# Patient Record
Sex: Male | Born: 2000 | Race: Black or African American | Hispanic: No | Marital: Single | Smoking: Never smoker
Health system: Southern US, Community
[De-identification: ages and names within clinical notes are randomized; demographics above are authoritative.]

## PROBLEM LIST (undated history)

## (undated) DIAGNOSIS — F419 Anxiety disorder, unspecified: Secondary | ICD-10-CM

## (undated) DIAGNOSIS — K219 Gastro-esophageal reflux disease without esophagitis: Secondary | ICD-10-CM

## (undated) HISTORY — DX: Anxiety disorder, unspecified: F41.9

## (undated) HISTORY — DX: Gastro-esophageal reflux disease without esophagitis: K21.9

---
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
## 2021-01-01 ENCOUNTER — Encounter: Payer: Self-pay | Admitting: Gastroenterology

## 2021-01-09 DIAGNOSIS — Z713 Dietary counseling and surveillance: Secondary | ICD-10-CM | POA: Diagnosis not present

## 2021-01-09 DIAGNOSIS — Z7182 Exercise counseling: Secondary | ICD-10-CM | POA: Diagnosis not present

## 2021-01-09 DIAGNOSIS — Z00129 Encounter for routine child health examination without abnormal findings: Secondary | ICD-10-CM | POA: Diagnosis not present

## 2021-01-09 DIAGNOSIS — Z68.41 Body mass index (BMI) pediatric, 5th percentile to less than 85th percentile for age: Secondary | ICD-10-CM | POA: Diagnosis not present

## 2021-01-10 DIAGNOSIS — Z130 Encounter for screening for diseases of the blood and blood-forming organs and certain disorders involving the immune mechanism: Secondary | ICD-10-CM | POA: Diagnosis not present

## 2021-01-13 DIAGNOSIS — Z419 Encounter for procedure for purposes other than remedying health state, unspecified: Secondary | ICD-10-CM | POA: Diagnosis not present

## 2021-02-13 DIAGNOSIS — Z419 Encounter for procedure for purposes other than remedying health state, unspecified: Secondary | ICD-10-CM | POA: Diagnosis not present

## 2021-03-15 DIAGNOSIS — Z419 Encounter for procedure for purposes other than remedying health state, unspecified: Secondary | ICD-10-CM | POA: Diagnosis not present

## 2021-04-15 DIAGNOSIS — Z419 Encounter for procedure for purposes other than remedying health state, unspecified: Secondary | ICD-10-CM | POA: Diagnosis not present

## 2021-05-01 ENCOUNTER — Encounter: Payer: Self-pay | Admitting: Physician Assistant

## 2021-05-01 ENCOUNTER — Other Ambulatory Visit (INDEPENDENT_AMBULATORY_CARE_PROVIDER_SITE_OTHER): Payer: Medicaid Other

## 2021-05-01 ENCOUNTER — Ambulatory Visit (INDEPENDENT_AMBULATORY_CARE_PROVIDER_SITE_OTHER): Payer: Medicaid Other | Admitting: Physician Assistant

## 2021-05-01 VITALS — BP 110/60 | HR 98 | Ht 70.0 in | Wt 165.0 lb

## 2021-05-01 DIAGNOSIS — R1010 Upper abdominal pain, unspecified: Secondary | ICD-10-CM

## 2021-05-01 DIAGNOSIS — R112 Nausea with vomiting, unspecified: Secondary | ICD-10-CM

## 2021-05-01 DIAGNOSIS — K2970 Gastritis, unspecified, without bleeding: Secondary | ICD-10-CM | POA: Diagnosis not present

## 2021-05-01 DIAGNOSIS — R634 Abnormal weight loss: Secondary | ICD-10-CM

## 2021-05-01 LAB — CBC WITH DIFFERENTIAL/PLATELET
Basophils Absolute: 0 10*3/uL (ref 0.0–0.1)
Basophils Relative: 0.6 % (ref 0.0–3.0)
Eosinophils Absolute: 0 10*3/uL (ref 0.0–0.7)
Eosinophils Relative: 0.5 % (ref 0.0–5.0)
HCT: 45.6 % (ref 36.0–49.0)
Hemoglobin: 15.2 g/dL (ref 12.0–16.0)
Lymphocytes Relative: 14 % — ABNORMAL LOW (ref 24.0–48.0)
Lymphs Abs: 0.9 10*3/uL (ref 0.7–4.0)
MCHC: 33.4 g/dL (ref 31.0–37.0)
MCV: 85.4 fl (ref 78.0–98.0)
Monocytes Absolute: 1 10*3/uL (ref 0.1–1.0)
Monocytes Relative: 16.5 % — ABNORMAL HIGH (ref 3.0–12.0)
Neutro Abs: 4.3 10*3/uL (ref 1.4–7.7)
Neutrophils Relative %: 68.4 % (ref 43.0–71.0)
Platelets: 251 10*3/uL (ref 150.0–575.0)
RBC: 5.35 Mil/uL (ref 3.80–5.70)
RDW: 12.9 % (ref 11.4–15.5)
WBC: 6.3 10*3/uL (ref 4.5–13.5)

## 2021-05-01 LAB — COMPREHENSIVE METABOLIC PANEL
ALT: 12 U/L (ref 0–53)
AST: 14 U/L (ref 0–37)
Albumin: 4.5 g/dL (ref 3.5–5.2)
Alkaline Phosphatase: 70 U/L (ref 52–171)
BUN: 8 mg/dL (ref 6–23)
CO2: 30 mEq/L (ref 19–32)
Calcium: 9.6 mg/dL (ref 8.4–10.5)
Chloride: 104 mEq/L (ref 96–112)
Creatinine, Ser: 1.33 mg/dL (ref 0.40–1.50)
GFR: 77.49 mL/min (ref >60.00)
Glucose, Bld: 109 mg/dL — ABNORMAL HIGH (ref 70–99)
Potassium: 3.8 mEq/L (ref 3.5–5.1)
Sodium: 139 mEq/L (ref 135–145)
Total Bilirubin: 0.6 mg/dL (ref 0.2–1.2)
Total Protein: 7.5 g/dL (ref 6.0–8.3)

## 2021-05-01 LAB — SEDIMENTATION RATE: Sed Rate: 4 mm/hr (ref 0–15)

## 2021-05-01 LAB — LIPASE: Lipase: 23 U/L (ref 11.0–59.0)

## 2021-05-01 LAB — C-REACTIVE PROTEIN: CRP: 1.7 mg/dL (ref 0.5–20.0)

## 2021-05-01 MED ORDER — FAMOTIDINE 40 MG PO TABS: 40.0000 mg | ORAL_TABLET | Freq: Every day | ORAL | 3 refills | Status: DC

## 2021-05-01 MED ORDER — OMEPRAZOLE 40 MG PO CPDR: 40.0000 mg | DELAYED_RELEASE_CAPSULE | Freq: Every day | ORAL | 4 refills | Status: DC

## 2021-05-01 MED ORDER — ONDANSETRON HCL 4 MG PO TABS: 4.0000 mg | ORAL_TABLET | Freq: Four times a day (QID) | ORAL | 1 refills | Status: DC | PRN

## 2021-05-01 NOTE — Patient Instructions (Signed)
If you are age 19 or younger, your body mass index should be between 19-25. Your Body mass index is 23.68 kg/m. If this is out of the aformentioned range listed, please consider follow up with your Primary Care Provider.  ________________________________________________________  The [facility] providers would like to encourage you to use [facility] to communicate with providers for non-urgent requests or questions.  Due to long hold times on the telephone, sending your provider a message by [facility] may be a faster and more efficient way to get a response.  Please allow 48 business hours for a response.  Please remember that this is for non-urgent requests.  _______________________________________________________  Dennis Bast have been scheduled for a CT scan of the abdomen and pelvis at [facility], 1st floor Radiology. You are scheduled on 05/09/2021  at 1:00 pm. You should arrive 15 minutes prior to your appointment time for registration.  Please pick up 2 bottles of contrast from [facility] at least 3 days prior to your scan. The solution may taste better if refrigerated, but do NOT add ice or any other liquid to this solution. Shake well before drinking.   Please follow the written instructions below on the day of your exam:   1) Do not eat anything after 9:00 am (4 hours prior to your test)   2) Drink 1 bottle of contrast @ 11;00 am (2 hours prior to your exam)  Remember to shake well before drinking and do NOT pour over ice.     Drink 1 bottle of contrast @ 12:00 pm (1 hour prior to your exam)   You may take any medications as prescribed with a small amount of water, if necessary. If you take any of the following medications: METFORMIN, GLUCOPHAGE, GLUCOVANCE, AVANDAMET, RIOMET, FORTAMET, Brice MET, JANUMET, GLUMETZA or METAGLIP, you MAY be asked to HOLD this medication 48 hours AFTER the exam.   The purpose of you drinking the oral contrast is to aid in the visualization of your intestinal  tract. The contrast solution may cause some diarrhea. Depending on your individual set of symptoms, you may also receive an intravenous injection of x-ray contrast/dye. Plan on being at [facility] for 45 minutes or longer, depending on the type of exam you are having performed.   If you have any questions regarding your exam or if you need to reschedule, you may call [facility] at [phone] between the hours of 8:00 am and 5:00 pm, Monday-Friday.   Your provider has requested that you go to the basement level for lab work before leaving today. Press "B" on the elevator. The lab is located at the first door on the left as you exit the elevator.  Continue Omeprazole 40 mg 1 capsule in the morning.  START Famotidine 40 mg 1 tablet before bed.  START Ondansetron 4 mg 1 tablet every 6 hours as needed.  Eat small snacks  between meals. Follow a bland diet.

## 2021-05-01 NOTE — Progress Notes (Signed)
Subjective:    Patient ID: Steven Cross, male    DOB: Nov 11, 2000, 20 y.o.   MRN: [id number]  HPI Steven Cross is a 20 year old African-American male, established with Dr. Ardis Hughs.  He had undergone work-up in July 2022 for complaints of GERD symptoms with burning in the upper abdomen and chest.  He had also had some intermittent rectal pain and blood in his stool. He underwent EGD which showed mild gastritis, biopsies were negative for H. pylori, no intestinal metaplasia.  Colonoscopy done at that same time pertinent for small internal hemorrhoids and otherwise unremarkable. He has been placed on omeprazole 40 mg p.o. every morning and has been taking a fiber supplement for the hemorrhoids. 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 He says he is still having abdominal pain with eating very frequently , and that the pain is generally exacerbated fairly immediately after he starts eating.  If he tries to continue to eat and push through this he will wind up vomiting.  He usually stops eating and then may not eat for many hours thereafter.  His weight is down about 10 pounds over the past 3-1/2 months.  He relates intermittent abdominal cramping, no changes with his bowel movements, no melena or hematochezia He is not taking any regular aspirin or NSAIDs, denies any regular EtOH use. He has been taking an over-the-counter supplement for nausea which she showed me and this primarily contains ginger. He has been taking the omeprazole regularly and says that it does help with heartburn symptoms but has not really helped with the pain. He is frustrated by his inability to eat, weight loss and persistent symptoms.  Review of Systems. Pertinent positive and negative review of systems were noted in the above HPI section.  All other review of systems was otherwise negative.   Outpatient Encounter Medications as of 05/01/2021  Medication Sig   calcium carbonate (TUMS  - DOSED IN MG ELEMENTAL CALCIUM) 500 MG chewable tablet Chew 1 tablet by mouth as needed for indigestion or heartburn.   famotidine (PEPCID) 40 MG tablet Take 1 tablet (40 mg total) by mouth daily.   ondansetron (ZOFRAN) 4 MG tablet Take 1 tablet (4 mg total) by mouth every 6 (six) hours as needed for nausea or vomiting.   [DISCONTINUED] omeprazole (PRILOSEC) 40 MG capsule Take 40 mg by mouth daily.   omeprazole (PRILOSEC) 40 MG capsule Take 1 capsule (40 mg total) by mouth daily.   No facility-administered encounter medications on file as of 05/01/2021.   No Known Allergies Patient Active Problem List   Diagnosis Date Noted   GERD without esophagitis 12/13/2020   Social History   Socioeconomic History   Marital status: Single    Spouse name: Not on file   Number of children: Not on file   Years of education: Not on file   Highest education level: Not on file  Occupational History   Not on file  Tobacco Use   Smoking status: Never   Smokeless tobacco: Never  Vaping Use   Vaping Use: Never used  Substance and Sexual Activity   Alcohol use: No   Drug use: No   Sexual activity: Not on file  Other Topics Concern   Not on file  Social History Narrative   Not on file   Social Determinants of Health   Financial Resource Strain: Not on file  Food Insecurity: Not on file  Transportation Needs: Not on file  Physical  Activity: Not on file  Stress: Not on file  Social Connections: Not on file  Intimate Partner Violence: Not on file    [name] family history includes Asthma in his mother.      Objective:    Vitals:   05/01/21 1417  BP: 110/60  Pulse: 98    Physical Exam Well-developed well-nourished young African-American male in no acute distress.  Accompanied by his mother height, Weight, 165 BMI 23.6  HEENT; nontraumatic normocephalic, EOMI, PE R LA, sclera anicteric. 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  He has had a 10 pound weight loss over the past 3 months.  EGD July 2022 pertinent for mild gastritis/no H. pylori and colonoscopy July 2022 negative except internal hemorrhoids  Etiology of his symptoms is not clear, certainly out of proportion to what is normally seen with gastritis.  Rule out other intra-abdominal inflammatory process/IBD, rule out possible gallbladder disease, doubt IBS/functional dyspepsia with weight loss  Plan; CBC with differential, c-Met, sed rate, TSH Schedule for CT of the abdomen pelvis with contrast. Continue omeprazole 40 mg p.o. every morning AC breakfast Add famotidine/Pepcid 40 mg every afternoon to be taken 1 to 2 hours before bedtime Zofran 4 mg every 6 hours as needed for nausea Patient encouraged to eat a bland diet, and add in between meal high-protein/bland snacks to avoid further weight loss. Further recommendations pending results of above.  Harvy Riera S Esther Bradstreet PA-C 05/01/2021   Cc: [place], [place]

## 2021-05-02 NOTE — Progress Notes (Signed)
I agree with the above note, plan 

## 2021-05-09 ENCOUNTER — Ambulatory Visit (HOSPITAL_COMMUNITY)
Admission: RE | Admit: 2021-05-09 | Discharge: 2021-05-09 | Disposition: A | Discharge status: Home / Self Care | Payer: Medicaid Other | Source: Ambulatory Visit | Attending: Physician Assistant | Admitting: Physician Assistant

## 2021-05-09 ENCOUNTER — Other Ambulatory Visit: Payer: Self-pay

## 2021-05-09 ENCOUNTER — Ambulatory Visit (HOSPITAL_COMMUNITY): Payer: Medicaid Other

## 2021-05-09 ENCOUNTER — Encounter (HOSPITAL_COMMUNITY): Payer: Self-pay

## 2021-05-09 DIAGNOSIS — R634 Abnormal weight loss: Secondary | ICD-10-CM | POA: Diagnosis not present

## 2021-05-09 DIAGNOSIS — R1110 Vomiting, unspecified: Secondary | ICD-10-CM | POA: Diagnosis not present

## 2021-05-09 DIAGNOSIS — K2970 Gastritis, unspecified, without bleeding: Secondary | ICD-10-CM | POA: Diagnosis not present

## 2021-05-09 DIAGNOSIS — R1010 Upper abdominal pain, unspecified: Secondary | ICD-10-CM | POA: Insufficient documentation

## 2021-05-09 DIAGNOSIS — R112 Nausea with vomiting, unspecified: Secondary | ICD-10-CM | POA: Insufficient documentation

## 2021-05-09 DIAGNOSIS — R109 Unspecified abdominal pain: Secondary | ICD-10-CM | POA: Diagnosis not present

## 2021-05-09 MED ORDER — IOHEXOL 350 MG/ML SOLN
75.0000 mL | Freq: Once | INTRAVENOUS | Status: AC | PRN
  Administered 2021-05-09: 75 mL via INTRAVENOUS

## 2021-05-09 MED ORDER — SODIUM CHLORIDE (PF) 0.9 % IJ SOLN
INTRAMUSCULAR | Status: AC
  Filled 2021-05-09: qty 50

## 2021-05-14 ENCOUNTER — Telehealth: Payer: Self-pay | Admitting: Physician Assistant

## 2021-05-14 NOTE — Telephone Encounter (Signed)
Patient's mother returned your call regarding CT results.  Please call her at [phone].  Thank you.

## 2021-05-14 NOTE — Telephone Encounter (Signed)
See CT result note

## 2021-05-15 DIAGNOSIS — Z419 Encounter for procedure for purposes other than remedying health state, unspecified: Secondary | ICD-10-CM | POA: Diagnosis not present

## 2021-05-28 ENCOUNTER — Ambulatory Visit (INDEPENDENT_AMBULATORY_CARE_PROVIDER_SITE_OTHER): Payer: Medicaid Other | Admitting: Gastroenterology

## 2021-05-28 ENCOUNTER — Encounter: Payer: Self-pay | Admitting: Gastroenterology

## 2021-05-28 VITALS — BP 84/48 | HR 64 | Ht 69.0 in | Wt 164.2 lb

## 2021-05-28 DIAGNOSIS — K219 Gastro-esophageal reflux disease without esophagitis: Secondary | ICD-10-CM

## 2021-05-28 MED ORDER — OMEPRAZOLE 40 MG PO CPDR: DELAYED_RELEASE_CAPSULE | ORAL | 4 refills | Status: DC

## 2021-05-28 NOTE — Progress Notes (Signed)
Review of pertinent gastrointestinal problems: 1.  Minor rectal bleeding led to colonoscopy July 2022.  Internal hemorrhoids were found.  The examination was otherwise normal.  I recommended that he try fiber supplements on a daily basis. 2.  Epigastric abdominal pain led to upper endoscopy July 2022.  This showed minimal nonspecific distal gastritis.  Biopsies were negative for H. pylori.  HPI: This is a very pleasant 20 year old man who is here with his mother today.   He was last here in our office and he saw Amy about 1 month ago.  He was having abdominal pain, usually postprandial, intermittent nausea and vomiting.  Blood work and imaging were done.  He was recommended to take omeprazole once daily and Pepcid at bedtime.  Blood work November 2022 showed normal CBC, normal CRP, normal sed rate, normal lipase, normal complete metabolic profile. CT scan abdomen pelvis with IV and oral contrast was essentially normal except for large stool burden.   His weight is 1 pound lower than it was 1 month ago.  Same scale.  He is doing much much better on omeprazole 40 mg once every morning and Pepcid 40 mg at bedtime every night.  On this regimen he has no GI symptoms at all.  His mother is asking for a school note because of some absences that he suffered while undergoing all of this testing.   ROS: complete GI ROS as described in HPI, all other review negative.  Constitutional:  No unintentional weight loss   Past Medical History:  Diagnosis Date   Anxiety    GERD (gastroesophageal reflux disease)     History reviewed. No pertinent surgical history.  Current Outpatient Medications  Medication Sig Dispense Refill   calcium carbonate (TUMS - DOSED IN MG ELEMENTAL CALCIUM) 500 MG chewable tablet Chew 1 tablet by mouth as needed for indigestion or heartburn.     famotidine (PEPCID) 40 MG tablet Take 1 tablet (40 mg total) by mouth daily. 30 tablet 3   omeprazole (PRILOSEC) 40 MG capsule  Take 1 capsule (40 mg total) by mouth daily. 30 capsule 4   ondansetron (ZOFRAN) 4 MG tablet Take 1 tablet (4 mg total) by mouth every 6 (six) hours as needed for nausea or vomiting. 40 tablet 1   No current facility-administered medications for this visit.    Allergies as of 05/28/2021   (No Known Allergies)    Family History  Problem Relation Age of Onset   Asthma Mother    Colon cancer Neg Hx    Esophageal cancer Neg Hx    Stomach cancer Neg Hx     Social History   Socioeconomic History   Marital status: Single    Spouse name: Not on file   Number of children: Not on file   Years of education: Not on file   Highest education level: Not on file  Occupational History   Not on file  Tobacco Use   Smoking status: Never   Smokeless tobacco: Never  Vaping Use   Vaping Use: Never used  Substance and Sexual Activity   Alcohol use: No   Drug use: No   Sexual activity: Not on file  Other Topics Concern   Not on file  Social History Narrative   Not on file   Social Determinants of Health   Financial Resource Strain: Not on file  Food Insecurity: Not on file  Transportation Needs: Not on file  Physical Activity: Not on file  Stress: Not on file  Social Connections: Not on file  Intimate Partner Violence: Not on file     Physical Exam: BP (!) 84/48 (BP Location: Left Arm, Patient Position: Sitting, Cuff Size: Normal)    Pulse 64    Ht 5\' 9"  (1.753 m) Comment: height measured without shoes   Wt 164 lb 4 oz (74.5 kg)    BMI 24.26 kg/m  Constitutional: generally well-appearing Psychiatric: alert and oriented x3 Abdomen: soft, nontender, nondistended, no obvious ascites, no peritoneal signs, normal bowel sounds No peripheral edema noted in lower extremities  Assessment and plan: 20 y.o. male with GERD related symptoms  Colonoscopy, EGD, CT scan and blood work were all essentially unrevealing.  He is much improved however on proton pump inhibitor every morning and  Pepcid H2 blocker at bedtime every night.  He is quite young.  I asked that he try to taper off the proton pump inhibitor and take only the H2 blocker at bedtime every night.  He will follow-up on an as-needed basis.  I am happy to help with a school note for absences that he suffered when undergoing TESTING.  Please see the "Patient Instructions" section for addition details about the plan.  12, MD [facility] 05/28/2021, 10:52 AM   Total time on date of encounter was 25 minutes (this included time spent preparing to see the patient reviewing records; obtaining and/or reviewing separately obtained history; performing a medically appropriate exam and/or evaluation; counseling and educating the patient and family if present; ordering medications, tests or procedures if applicable; and documenting clinical information in the health record).

## 2021-05-28 NOTE — Patient Instructions (Signed)
If you are age 19 or younger, your body mass index should be between 19-25. Your Body mass index is 24.26 kg/m. If this is out of the aformentioned range listed, please consider follow up with your Primary Care Provider.  _______________________________________________________  The [facility] providers would like to encourage you to use [facility] to communicate with providers for non-urgent requests or questions.  Due to long hold times on the telephone, sending your provider a message by [facility] may be a faster and more efficient way to get a response.  Please allow 48 business hours for a response.  Please remember that this is for non-urgent requests.  _______________________________________________________  DECREASE: omeprazole to one capsule every other day for 1 week, then one capsule every third day for one week, then discontinue.  CONTINUE: Pepcid at bedtime.  You will follow up in our office on an as needed basis.  Thank you for entrusting me with your care and choosing [facility].  Dr Christella Hartigan

## 2021-06-15 DIAGNOSIS — Z419 Encounter for procedure for purposes other than remedying health state, unspecified: Secondary | ICD-10-CM | POA: Diagnosis not present

## 2021-07-16 DIAGNOSIS — Z419 Encounter for procedure for purposes other than remedying health state, unspecified: Secondary | ICD-10-CM | POA: Diagnosis not present

## 2021-08-13 DIAGNOSIS — Z419 Encounter for procedure for purposes other than remedying health state, unspecified: Secondary | ICD-10-CM | POA: Diagnosis not present

## 2021-09-13 DIAGNOSIS — Z419 Encounter for procedure for purposes other than remedying health state, unspecified: Secondary | ICD-10-CM | POA: Diagnosis not present

## 2021-10-13 DIAGNOSIS — Z419 Encounter for procedure for purposes other than remedying health state, unspecified: Secondary | ICD-10-CM | POA: Diagnosis not present

## 2021-11-13 DIAGNOSIS — Z419 Encounter for procedure for purposes other than remedying health state, unspecified: Secondary | ICD-10-CM | POA: Diagnosis not present

## 2021-12-13 DIAGNOSIS — Z419 Encounter for procedure for purposes other than remedying health state, unspecified: Secondary | ICD-10-CM | POA: Diagnosis not present

## 2022-01-07 DIAGNOSIS — M25512 Pain in left shoulder: Secondary | ICD-10-CM | POA: Diagnosis not present

## 2022-01-13 DIAGNOSIS — Z419 Encounter for procedure for purposes other than remedying health state, unspecified: Secondary | ICD-10-CM | POA: Diagnosis not present

## 2022-01-20 ENCOUNTER — Other Ambulatory Visit (HOSPITAL_COMMUNITY): Payer: Self-pay | Admitting: Orthopaedic Surgery

## 2022-01-20 ENCOUNTER — Other Ambulatory Visit: Payer: Self-pay | Admitting: Orthopaedic Surgery

## 2022-01-20 DIAGNOSIS — S43432A Superior glenoid labrum lesion of left shoulder, initial encounter: Secondary | ICD-10-CM

## 2022-01-20 DIAGNOSIS — M25512 Pain in left shoulder: Secondary | ICD-10-CM | POA: Diagnosis not present

## 2022-01-28 ENCOUNTER — Ambulatory Visit (HOSPITAL_COMMUNITY)
Admission: RE | Admit: 2022-01-28 | Discharge: 2022-01-28 | Disposition: A | Discharge status: Home / Self Care | Payer: Medicaid Other | Source: Ambulatory Visit | Attending: Orthopaedic Surgery | Admitting: Orthopaedic Surgery

## 2022-01-28 DIAGNOSIS — S43432A Superior glenoid labrum lesion of left shoulder, initial encounter: Secondary | ICD-10-CM | POA: Insufficient documentation

## 2022-01-28 MED ORDER — GADOBUTROL 1 MMOL/ML IV SOLN
0.0500 mL | Freq: Once | INTRAVENOUS | Status: AC | PRN
  Administered 2022-01-28: 0.05 mL

## 2022-01-28 MED ORDER — IOHEXOL 300 MG/ML  SOLN
50.0000 mL | Freq: Once | INTRAMUSCULAR | Status: AC | PRN
  Administered 2022-01-28: 50 mL via INTRA_ARTICULAR

## 2022-01-28 MED ORDER — LIDOCAINE HCL (PF) 1 % IJ SOLN
INTRAMUSCULAR | Status: AC
  Filled 2022-01-28: qty 5

## 2022-01-30 DIAGNOSIS — M25512 Pain in left shoulder: Secondary | ICD-10-CM | POA: Diagnosis not present

## 2022-02-13 DIAGNOSIS — Z419 Encounter for procedure for purposes other than remedying health state, unspecified: Secondary | ICD-10-CM | POA: Diagnosis not present

## 2022-02-24 ENCOUNTER — Encounter (HOSPITAL_BASED_OUTPATIENT_CLINIC_OR_DEPARTMENT_OTHER): Payer: Self-pay | Admitting: Orthopaedic Surgery

## 2022-02-24 ENCOUNTER — Other Ambulatory Visit: Payer: Self-pay

## 2022-03-04 NOTE — Discharge Instructions (Signed)
Ophelia Charter MD, MPH Noemi Chapel, PA-C [place] [address] [phone] (tel)   [phone] (fax)   POST-OPERATIVE INSTRUCTIONS - SHOULDER ARTHROSCOPY  WOUND CARE You may remove the Operative Dressing on Post-Op Day #3 (72hrs after surgery).   Alternatively if you would like you can leave dressing on until follow-up if within 7-8 days but keep it dry. Leave steri-strips in place until they fall off on their own, usually 2 weeks postop. There may be a small amount of fluid/bleeding leaking at the surgical site.  This is normal; the shoulder is filled with fluid during the procedure and can leak for 24-48hrs after surgery.  You may change/reinforce the bandage as needed.  Use the Cryocuff or Ice as often as possible for the first 7 days, then as needed for pain relief. Always keep a towel, ACE wrap or other barrier between the cooling unit and your skin.  You may shower on Post-Op Day #3. Gently pat the area dry.  Do not soak the shoulder in water or submerge it.  Keep incisions as dry as possible. Do not go swimming in the pool or ocean until 4 weeks after surgery or when otherwise instructed.    EXERCISES Wear the sling at all times  You may remove the sling for showering, but keep the arm across the chest or in a secondary sling.     It is normal for your fingers/hand to become more swollen after surgery and discolored from bruising.   This will resolve over the first few weeks usually after surgery. Please continue to ambulate and do not stay sitting or lying for too long.  Perform foot and wrist pumps to assist in circulation.  PHYSICAL THERAPY - You will begin physical therapy soon after surgery (unless otherwise specified) - Please call to set up an appointment, if you do not already have one  - Let our office if there are any issues with scheduling your therapy  - Our office will call you to schedule post-op physical  therapy  REGIONAL ANESTHESIA (NERVE BLOCKS) The anesthesia team may have performed a nerve block for you this is a great tool used to minimize pain.   The block may start wearing off overnight (between 8-24 hours postop) When the block wears off, your pain may go from nearly zero to the pain you would have had postop without the block. This is an abrupt transition but nothing dangerous is happening.   This can be a challenging period but utilize your as needed pain medications to try and manage this period. We suggest you use the pain medication the first night prior to going to bed, to ease this transition.  You may take an extra dose of narcotic when this happens if needed  POST-OP MEDICATIONS- Multimodal approach to pain control In general your pain will be controlled with a combination of substances.  Prescriptions unless otherwise discussed are electronically sent to your pharmacy.  This is a carefully made plan we use to minimize narcotic use.     Meloxicam - Anti-inflammatory medication taken on a scheduled basis Acetaminophen - Non-narcotic pain medicine taken on a scheduled basis. Next tylenol dose after 3pm. Oxycodone - This is a strong narcotic, to be used only on an "as needed" basis for SEVERE pain. Zofran - take as needed for nausea   FOLLOW-UP If you develop a Fever (?101.5), Redness or Drainage from the surgical incision site, please call our office to arrange for  an evaluation. Please call the office to schedule a follow-up appointment for your first post-operative appointment, 7-10 days post-operatively.    HELPFUL INFORMATION   You may be more comfortable sleeping in a semi-seated position the first few nights following surgery.  Keep a pillow propped under the elbow and forearm for comfort.  If you have a recliner type of chair it might be beneficial.  If not that is fine too, but it would be helpful to sleep propped up with pillows behind your operated shoulder as  well under your elbow and forearm.  This will reduce pulling on the suture lines.  When dressing, put your operative arm in the sleeve first.  When getting undressed, take your operative arm out last.  Loose fitting, button-down shirts are recommended.  Often in the first days after surgery you may be more comfortable keeping your operative arm under your shirt and not through the sleeve.  You may return to work/school in the next couple of days when you feel up to it.  Desk work and typing in the sling is fine.  We suggest you use the pain medication the first night prior to going to bed, in order to ease any pain when the anesthesia wears off. You should avoid taking pain medications on an empty stomach as it will make you nauseous.  You should wean off your narcotic medicines as soon as you are able.  Most patients will be off or using minimal narcotics before their first postop appointment.   Do not drink alcoholic beverages or take illicit drugs when taking pain medications.  It is against the law to drive while taking narcotics.  In some states it is against the law to drive while your arm is in a sling.   Pain medication may make you constipated.  Below are a few solutions to try in this order: Decrease the amount of pain medication if you aren't having pain. Drink lots of decaffeinated fluids. Drink prune juice and/or eat dried prunes  If the first 3 don't work start with additional solutions Take Colace - an over-the-counter stool softener Take Senokot - an over-the-counter laxative Take Miralax - a stronger over-the-counter laxative  For more information including helpful videos and documents visit our website:   [email or web address]    Post Anesthesia Home Care Instructions  Activity: Get plenty of rest for the remainder of the day. A responsible individual must stay with you for 24 hours following the procedure.  For the next 24 hours, DO  NOT: -Drive a car -Advertising copywriter -Drink alcoholic beverages -Take any medication unless instructed by your physician -Make any legal decisions or sign important papers.  Meals: Start with liquid foods such as gelatin or soup. Progress to regular foods as tolerated. Avoid greasy, spicy, heavy foods. If nausea and/or vomiting occur, drink only clear liquids until the nausea and/or vomiting subsides. Call your physician if vomiting continues.  Special Instructions/Symptoms: Your throat may feel dry or sore from the anesthesia or the breathing tube placed in your throat during surgery. If this causes discomfort, gargle with warm salt water. The discomfort should disappear within 24 hours.  If you had a scopolamine patch placed behind your ear for the management of post- operative nausea and/or vomiting:  1. The medication in the patch is effective for 72 hours, after which it should be removed.  Wrap patch in a tissue and discard in the trash. Wash hands thoroughly with soap and water. 2. You may remove the  patch earlier than 72 hours if you experience unpleasant side effects which may include dry mouth, dizziness or visual disturbances. 3. Avoid touching the patch. Wash your hands with soap and water after contact with the patch.    Information for Discharge Teaching: EXPAREL (bupivacaine liposome injectable suspension)   Your surgeon or anesthesiologist gave you EXPAREL(bupivacaine) to help control your pain after surgery.  EXPAREL is a local anesthetic that provides pain relief by numbing the tissue around the surgical site. EXPAREL is designed to release pain medication over time and can control pain for up to 72 hours. Depending on how you respond to EXPAREL, you may require less pain medication during your recovery.  Possible side effects: Temporary loss of sensation or ability to move in the area where bupivacaine was injected. Nausea, vomiting, constipation Rarely, numbness and  tingling in your mouth or lips, lightheadedness, or anxiety may occur. Call your doctor right away if you think you may be experiencing any of these sensations, or if you have other questions regarding possible side effects.  Follow all other discharge instructions given to you by your surgeon or nurse. Eat a healthy diet and drink plenty of water or other fluids.  If you return to the hospital for any reason within 96 hours following the administration of EXPAREL, it is important for health care providers to know that you have received this anesthetic. A teal colored band has been placed on your arm with the date, time and amount of EXPAREL you have received in order to alert and inform your health care providers. Please leave this armband in place for the full 96 hours following administration, and then you may remove the band.

## 2022-03-04 NOTE — H&P (Signed)
PREOPERATIVE H&P  Chief Complaint: LEFT SHOULDER CARTILAGE DISORDER, INSTABILITY  HPI: Steven Cross is a 21 y.o. male who is scheduled for, Procedure(s): ARTHROSCOPY SHOULDER ARTHROSCOPIC BANKART REPAIR.   Patient has a past medical history significant for GERD.   The patient went to urgent care and had an injection for shoulder pain.  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  He did therapy afterwards and did okay.  He continues to have a sense of instability.  He had a subluxation event recently and was seen and managed with a physician based exercise plan and therapy and has continued to have a sense of instability.  He continues to feel that the shoulder is unstable.    Symptoms are rated as moderate to severe, and have been worsening.  This is significantly impairing activities of daily living.    Please see clinic note for further details on this patient's care.    He has elected for surgical management.   Past Medical History:  Diagnosis Date   Anxiety    GERD (gastroesophageal reflux disease)    History reviewed. No pertinent surgical history. Social History   Socioeconomic History   Marital status: Single    Spouse name: Not on file   Number of children: Not on file   Years of education: Not on file   Highest education level: Not on file  Occupational History   Not on file  Tobacco Use   Smoking status: Never   Smokeless tobacco: Never  Vaping Use   Vaping Use: Never used  Substance and Sexual Activity   Alcohol use: No   Drug use: No   Sexual activity: Not on file  Other Topics Concern   Not on file  Social History Narrative   Not on file   Social Determinants of Health   Financial Resource Strain: Not on file  Food Insecurity: Not on file  Transportation Needs: Not on file  Physical Activity: Not on file  Stress:  Not on file  Social Connections: Not on file   Family History  Problem Relation Age of Onset   Asthma Mother    Colon cancer Neg Hx    Esophageal cancer Neg Hx    Stomach cancer Neg Hx    No Known Allergies Prior to Admission medications   Medication Sig Start Date End Date Taking? Authorizing Provider  calcium carbonate (TUMS - DOSED IN MG ELEMENTAL CALCIUM) 500 MG chewable tablet Chew 1 tablet by mouth as needed for indigestion or heartburn.    [provider]  famotidine (PEPCID) 40 MG tablet Take 1 tablet (40 mg total) by mouth daily. Patient taking differently: Take 40 mg by mouth daily as needed for indigestion. 05/01/21   Esterwood, Amy S, PA-C  omeprazole (PRILOSEC) 40 MG capsule Take 1 capsule every other day for 1 week, then 1 capsule every third day for 1 week, then discontinue. 05/28/21   Rachael Fee, MD  ondansetron (ZOFRAN) 4 MG tablet Take 1 tablet (4 mg total) by mouth every 6 (six) hours as needed for nausea or vomiting. 05/01/21   Esterwood, Amy S, PA-C    ROS: All other systems have been reviewed and were otherwise negative with the exception of those mentioned in the HPI and as above.  Physical Exam: General: Alert, no acute distress Cardiovascular: No pedal edema Respiratory: No cyanosis,  no use of accessory musculature GI: No organomegaly, abdomen is soft and non-tender Skin: No lesions in the area of chief complaint Neurologic: Sensation intact distally Psychiatric: Patient is competent for consent with normal mood and affect Lymphatic: No axillary or cervical lymphadenopathy  MUSCULOSKELETAL:  On examination the range of motion of the left shoulder was 160-170 degrees. Positive anterior apprehension signs.   Imaging: MRI demonstrates anterior inferior and anterior superior labral tear.   Assessment: LEFT SHOULDER CARTILAGE DISORDER, INSTABILITY  Plan: Plan for Procedure(s): ARTHROSCOPY SHOULDER ARTHROSCOPIC BANKART REPAIR  The risks  benefits and alternatives were discussed with the patient including but not limited to the risks of nonoperative treatment, versus surgical intervention including infection, bleeding, nerve injury,  blood clots, cardiopulmonary complications, morbidity, mortality, among others, and they were willing to proceed.   The patient acknowledged the explanation, agreed to proceed with the plan and consent was signed.   Operative Plan: Left shoulder arthroscopy with labral repair Discharge Medications: Standard DVT Prophylaxis: None Physical Therapy: Outpatient PT Special Discharge needs: Sling. [place], PA-C  03/04/2022 4:22 PM

## 2022-03-05 ENCOUNTER — Ambulatory Visit (HOSPITAL_BASED_OUTPATIENT_CLINIC_OR_DEPARTMENT_OTHER): Payer: Medicaid Other | Admitting: Certified Registered"

## 2022-03-05 ENCOUNTER — Other Ambulatory Visit (HOSPITAL_COMMUNITY): Payer: Self-pay

## 2022-03-05 ENCOUNTER — Ambulatory Visit (HOSPITAL_BASED_OUTPATIENT_CLINIC_OR_DEPARTMENT_OTHER)
Admission: RE | Admit: 2022-03-05 | Discharge: 2022-03-05 | Disposition: A | Discharge status: Home / Self Care | Payer: Medicaid Other | Attending: Orthopaedic Surgery | Admitting: Orthopaedic Surgery

## 2022-03-05 ENCOUNTER — Encounter (HOSPITAL_BASED_OUTPATIENT_CLINIC_OR_DEPARTMENT_OTHER)
Admission: RE | Disposition: A | Discharge status: Home / Self Care | Payer: Self-pay | Source: Home / Self Care | Attending: Orthopaedic Surgery

## 2022-03-05 ENCOUNTER — Other Ambulatory Visit: Payer: Self-pay

## 2022-03-05 ENCOUNTER — Encounter (HOSPITAL_BASED_OUTPATIENT_CLINIC_OR_DEPARTMENT_OTHER): Payer: Self-pay | Admitting: Orthopaedic Surgery

## 2022-03-05 DIAGNOSIS — K219 Gastro-esophageal reflux disease without esophagitis: Secondary | ICD-10-CM | POA: Insufficient documentation

## 2022-03-05 DIAGNOSIS — S43432A Superior glenoid labrum lesion of left shoulder, initial encounter: Secondary | ICD-10-CM | POA: Insufficient documentation

## 2022-03-05 DIAGNOSIS — S43492A Other sprain of left shoulder joint, initial encounter: Secondary | ICD-10-CM | POA: Diagnosis not present

## 2022-03-05 DIAGNOSIS — Z01818 Encounter for other preprocedural examination: Secondary | ICD-10-CM

## 2022-03-05 DIAGNOSIS — M25312 Other instability, left shoulder: Secondary | ICD-10-CM | POA: Insufficient documentation

## 2022-03-05 DIAGNOSIS — X58XXXA Exposure to other specified factors, initial encounter: Secondary | ICD-10-CM | POA: Diagnosis not present

## 2022-03-05 DIAGNOSIS — G8918 Other acute postprocedural pain: Secondary | ICD-10-CM | POA: Diagnosis not present

## 2022-03-05 HISTORY — PX: BANKART REPAIR: SHX5173

## 2022-03-05 HISTORY — PX: SHOULDER ARTHROSCOPY: SHX128

## 2022-03-05 SURGERY — ARTHROSCOPY, SHOULDER
Anesthesia: Regional | Site: Shoulder | Laterality: Left

## 2022-03-05 MED ORDER — ONDANSETRON HCL 4 MG/2ML IJ SOLN
INTRAMUSCULAR | Status: AC
  Filled 2022-03-05: qty 2

## 2022-03-05 MED ORDER — MIDAZOLAM HCL 2 MG/2ML IJ SOLN
2.0000 mg | Freq: Once | INTRAMUSCULAR | Status: AC
  Administered 2022-03-05: 2 mg via INTRAVENOUS

## 2022-03-05 MED ORDER — PROMETHAZINE HCL 25 MG/ML IJ SOLN: 6.2500 mg | INTRAMUSCULAR | Status: DC | PRN

## 2022-03-05 MED ORDER — KETOROLAC TROMETHAMINE 30 MG/ML IJ SOLN: 30.0000 mg | Freq: Once | INTRAMUSCULAR | Status: DC | PRN

## 2022-03-05 MED ORDER — OXYCODONE HCL 5 MG PO TABS
5.0000 mg | ORAL_TABLET | Freq: Four times a day (QID) | ORAL | 0 refills | Status: DC | PRN
  Filled 2022-03-05 (×2): qty 24, 3d supply, fill #0

## 2022-03-05 MED ORDER — ROCURONIUM BROMIDE 10 MG/ML (PF) SYRINGE
PREFILLED_SYRINGE | INTRAVENOUS | Status: AC
  Filled 2022-03-05: qty 10

## 2022-03-05 MED ORDER — DICLOFENAC SODIUM 75 MG PO TBEC
75.0000 mg | DELAYED_RELEASE_TABLET | Freq: Two times a day (BID) | ORAL | 0 refills | Status: DC | PRN
  Filled 2022-03-05 (×2): qty 60, 30d supply, fill #0

## 2022-03-05 MED ORDER — FENTANYL CITRATE (PF) 100 MCG/2ML IJ SOLN: 25.0000 ug | INTRAMUSCULAR | Status: DC | PRN

## 2022-03-05 MED ORDER — BUPIVACAINE HCL (PF) 0.5 % IJ SOLN
INTRAMUSCULAR | Status: DC | PRN
  Administered 2022-03-05: 15 mL via PERINEURAL

## 2022-03-05 MED ORDER — ACETAMINOPHEN 500 MG PO TABS
ORAL_TABLET | ORAL | Status: AC
  Filled 2022-03-05: qty 2

## 2022-03-05 MED ORDER — EPINEPHRINE PF 1 MG/ML IJ SOLN
INTRAMUSCULAR | Status: AC
  Filled 2022-03-05: qty 3

## 2022-03-05 MED ORDER — ONDANSETRON HCL 4 MG PO TABS: 4.0000 mg | ORAL_TABLET | Freq: Three times a day (TID) | ORAL | 0 refills | Status: AC | PRN

## 2022-03-05 MED ORDER — ONDANSETRON HCL 4 MG PO TABS
4.0000 mg | ORAL_TABLET | Freq: Four times a day (QID) | ORAL | 0 refills | Status: DC | PRN
  Filled 2022-03-05 (×2): qty 10, 3d supply, fill #0

## 2022-03-05 MED ORDER — PROPOFOL 10 MG/ML IV BOLUS
INTRAVENOUS | Status: AC
  Filled 2022-03-05: qty 20

## 2022-03-05 MED ORDER — PROPOFOL 10 MG/ML IV BOLUS
INTRAVENOUS | Status: DC | PRN
  Administered 2022-03-05: 200 mg via INTRAVENOUS

## 2022-03-05 MED ORDER — SODIUM CHLORIDE 0.9 % IR SOLN
Status: DC | PRN
  Administered 2022-03-05 (×2): 3000 mL

## 2022-03-05 MED ORDER — FENTANYL CITRATE (PF) 100 MCG/2ML IJ SOLN
INTRAMUSCULAR | Status: AC
  Filled 2022-03-05: qty 2

## 2022-03-05 MED ORDER — LACTATED RINGERS IV SOLN: INTRAVENOUS | Status: DC

## 2022-03-05 MED ORDER — LIDOCAINE HCL (CARDIAC) PF 100 MG/5ML IV SOSY
PREFILLED_SYRINGE | INTRAVENOUS | Status: DC | PRN
  Administered 2022-03-05: 40 mg via INTRAVENOUS

## 2022-03-05 MED ORDER — DEXAMETHASONE SODIUM PHOSPHATE 10 MG/ML IJ SOLN
INTRAMUSCULAR | Status: DC | PRN
  Administered 2022-03-05: 10 mg via INTRAVENOUS

## 2022-03-05 MED ORDER — ACETAMINOPHEN 500 MG PO TABS
1000.0000 mg | ORAL_TABLET | Freq: Once | ORAL | Status: AC
  Administered 2022-03-05: 1000 mg via ORAL

## 2022-03-05 MED ORDER — CEFAZOLIN SODIUM-DEXTROSE 2-4 GM/100ML-% IV SOLN
INTRAVENOUS | Status: AC
  Filled 2022-03-05: qty 100

## 2022-03-05 MED ORDER — ACETAMINOPHEN EXTRA STRENGTH 500 MG PO CAPS
1000.0000 mg | ORAL_CAPSULE | Freq: Three times a day (TID) | ORAL | 0 refills | Status: DC | PRN
  Filled 2022-03-05: qty 84, 14d supply, fill #0

## 2022-03-05 MED ORDER — DEXAMETHASONE SODIUM PHOSPHATE 10 MG/ML IJ SOLN
INTRAMUSCULAR | Status: AC
  Filled 2022-03-05: qty 1

## 2022-03-05 MED ORDER — FENTANYL CITRATE (PF) 100 MCG/2ML IJ SOLN: 100.0000 ug | Freq: Once | INTRAMUSCULAR | Status: DC

## 2022-03-05 MED ORDER — FENTANYL CITRATE (PF) 100 MCG/2ML IJ SOLN
INTRAMUSCULAR | Status: DC | PRN
  Administered 2022-03-05 (×2): 25 ug via INTRAVENOUS

## 2022-03-05 MED ORDER — AMISULPRIDE (ANTIEMETIC) 5 MG/2ML IV SOLN: 10.0000 mg | Freq: Once | INTRAVENOUS | Status: DC | PRN

## 2022-03-05 MED ORDER — MIDAZOLAM HCL 2 MG/2ML IJ SOLN
INTRAMUSCULAR | Status: AC
  Filled 2022-03-05: qty 2

## 2022-03-05 MED ORDER — MELOXICAM 7.5 MG PO TABS: 7.5000 mg | ORAL_TABLET | Freq: Two times a day (BID) | ORAL | 0 refills | Status: AC

## 2022-03-05 MED ORDER — BUPIVACAINE LIPOSOME 1.3 % IJ SUSP
INTRAMUSCULAR | Status: DC | PRN
  Administered 2022-03-05: 10 mL via PERINEURAL

## 2022-03-05 MED ORDER — OXYCODONE HCL 5 MG PO TABS: ORAL_TABLET | ORAL | 0 refills | Status: AC

## 2022-03-05 MED ORDER — ACETAMINOPHEN 500 MG PO TABS: 1000.0000 mg | ORAL_TABLET | Freq: Three times a day (TID) | ORAL | 0 refills | Status: AC

## 2022-03-05 MED ORDER — EPINEPHRINE PF 1 MG/ML IJ SOLN
INTRAMUSCULAR | Status: AC
  Filled 2022-03-05: qty 1

## 2022-03-05 MED ORDER — OXYCODONE HCL 5 MG PO TABS
5.0000 mg | ORAL_TABLET | Freq: Once | ORAL | Status: AC | PRN
  Administered 2022-03-05: 5 mg via ORAL

## 2022-03-05 MED ORDER — SODIUM CHLORIDE 0.9 % IR SOLN: Status: DC | PRN

## 2022-03-05 MED ORDER — CEFAZOLIN SODIUM-DEXTROSE 2-4 GM/100ML-% IV SOLN
2.0000 g | INTRAVENOUS | Status: AC
  Administered 2022-03-05: 2 g via INTRAVENOUS

## 2022-03-05 MED ORDER — OXYCODONE HCL 5 MG/5ML PO SOLN: 5.0000 mg | Freq: Once | ORAL | Status: AC | PRN

## 2022-03-05 MED ORDER — OXYCODONE HCL 5 MG PO TABS
ORAL_TABLET | ORAL | Status: AC
  Filled 2022-03-05: qty 1

## 2022-03-05 MED ORDER — LIDOCAINE 2% (20 MG/ML) 5 ML SYRINGE
INTRAMUSCULAR | Status: AC
  Filled 2022-03-05: qty 5

## 2022-03-05 MED ORDER — ONDANSETRON HCL 4 MG/2ML IJ SOLN
INTRAMUSCULAR | Status: DC | PRN
  Administered 2022-03-05: 4 mg via INTRAVENOUS

## 2022-03-05 SURGICAL SUPPLY — 55 items
ANCH SUT 2 FBRTK KNTLS 1.8 (Anchor) ×5 IMPLANT
ANCHOR SUT 1.8 FBRTK KNTLS 2SU (Anchor) IMPLANT
ANCHOR SUT 1.8 FIBERTAK SB KL (Anchor) IMPLANT
APL PRP STRL LF DISP 70% ISPRP (MISCELLANEOUS) ×1
BLADE EXCALIBUR 4.0X13 (MISCELLANEOUS) ×1 IMPLANT
BUR SURG 4D 13L RD FLUTE (BUR) IMPLANT
BURR SURG 4D 13L RD FLUTE (BUR)
CANNULA 5.75X71 LONG (CANNULA) ×1 IMPLANT
CANNULA TWIST IN 8.25X7CM (CANNULA) IMPLANT
CHLORAPREP W/TINT 26 (MISCELLANEOUS) ×1 IMPLANT
CLSR STERI-STRIP ANTIMIC 1/2X4 (GAUZE/BANDAGES/DRESSINGS) ×1 IMPLANT
COOLER ICEMAN CLASSIC (MISCELLANEOUS) ×1 IMPLANT
DISSECTOR 3.5MM X 13CM CVD (MISCELLANEOUS) IMPLANT
DISSECTOR 4.0MMX13CM CVD (MISCELLANEOUS) IMPLANT
DRAPE IMP U-DRAPE 54X76 (DRAPES) ×1 IMPLANT
DRAPE INCISE IOBAN 66X45 STRL (DRAPES) IMPLANT
DRAPE POUCH INSTRU U-SHP 10X18 (DRAPES) ×1 IMPLANT
DRAPE SHOULDER BEACH CHAIR (DRAPES) ×1 IMPLANT
DW OUTFLOW CASSETTE/TUBE SET (MISCELLANEOUS) ×1 IMPLANT
GAUZE PAD ABD 8X10 STRL (GAUZE/BANDAGES/DRESSINGS) ×1 IMPLANT
GAUZE SPONGE 4X4 12PLY STRL (GAUZE/BANDAGES/DRESSINGS) ×1 IMPLANT
GLOVE BIO SURGEON STRL SZ 6.5 (GLOVE) ×1 IMPLANT
GLOVE BIOGEL PI IND STRL 6.5 (GLOVE) ×1 IMPLANT
GLOVE BIOGEL PI IND STRL 8 (GLOVE) ×1 IMPLANT
GLOVE ECLIPSE 8.0 STRL XLNG CF (GLOVE) ×1 IMPLANT
GOWN STRL REUS W/ TWL LRG LVL3 (GOWN DISPOSABLE) ×2 IMPLANT
GOWN STRL REUS W/TWL LRG LVL3 (GOWN DISPOSABLE) ×2
GOWN STRL REUS W/TWL XL LVL3 (GOWN DISPOSABLE) ×1 IMPLANT
KIT PERC INSERT 3.0 KNTLS (KITS) ×1 IMPLANT
KIT STR SPEAR 1.8 FBRTK DISP (KITS) IMPLANT
LASSO 90 CVE QUICKPAS (DISPOSABLE) IMPLANT
LASSO CRESCENT QUICKPASS (SUTURE) IMPLANT
MANIFOLD NEPTUNE II (INSTRUMENTS) ×1 IMPLANT
NDL SAFETY ECLIP 18X1.5 (MISCELLANEOUS) ×1 IMPLANT
PACK ARTHROSCOPY DSU (CUSTOM PROCEDURE TRAY) ×1 IMPLANT
PACK BASIN DAY SURGERY FS (CUSTOM PROCEDURE TRAY) ×1 IMPLANT
PAD COLD SHLDR WRAP-ON (PAD) ×1 IMPLANT
PAD ORTHO SHOULDER 7X19 LRG (SOFTGOODS) IMPLANT
PORT APPOLLO RF 90DEGREE MULTI (SURGICAL WAND) IMPLANT
SHEET MEDIUM DRAPE 40X70 STRL (DRAPES) IMPLANT
SLEEVE ARM SUSPENSION SYSTEM (MISCELLANEOUS) IMPLANT
SLEEVE SCD COMPRESS KNEE MED (STOCKING) ×1 IMPLANT
SLING S3 LATERAL DISP (MISCELLANEOUS) IMPLANT
SPIKE FLUID TRANSFER (MISCELLANEOUS) IMPLANT
SUT FIBERWIRE #2 38 T-5 BLUE (SUTURE)
SUT MNCRL AB 4-0 PS2 18 (SUTURE) ×1 IMPLANT
SUT TIGER TAPE 7 IN WHITE (SUTURE) IMPLANT
SUTURE FIBERWR #2 38 T-5 BLUE (SUTURE) IMPLANT
SUTURE TAPE TIGERLINK 1.3MM BL (SUTURE) IMPLANT
SUTURETAPE TIGERLINK 1.3MM BL (SUTURE)
SYR 5ML LL (SYRINGE) ×1 IMPLANT
TAPE FIBER 2MM 7IN #2 BLUE (SUTURE) IMPLANT
TOWEL GREEN STERILE FF (TOWEL DISPOSABLE) ×1 IMPLANT
TUBE CONNECTING 20X1/4 (TUBING) IMPLANT
TUBING ARTHROSCOPY IRRIG 16FT (MISCELLANEOUS) ×1 IMPLANT

## 2022-03-05 NOTE — Anesthesia Procedure Notes (Signed)
Procedure Name: LMA Insertion Date/Time: 03/05/2022 9:53 AM  Performed by: Lavonia Dana, CRNAPre-anesthesia Checklist: Patient identified, Emergency Drugs available, Suction available and Patient being monitored Patient Re-evaluated:Patient Re-evaluated prior to induction Oxygen Delivery Method: Circle system utilized Preoxygenation: Pre-oxygenation with 100% oxygen Induction Type: IV induction Ventilation: Mask ventilation without difficulty LMA: LMA inserted LMA Size: 4.0 Number of attempts: 1 Airway Equipment and Method: Bite block Placement Confirmation: positive ETCO2 Tube secured with: Tape Dental Injury: Teeth and Oropharynx as per pre-operative assessment

## 2022-03-05 NOTE — Interval H&P Note (Signed)
All questions answered, patient wants to proceed with procedure. ? ?

## 2022-03-05 NOTE — Progress Notes (Signed)
Assisted Dr. Ellender with left, interscalene, ultrasound guided block. Side rails up, monitors on throughout procedure. See vital signs in flow sheet. Tolerated Procedure well. 

## 2022-03-05 NOTE — Transfer of Care (Signed)
Immediate Anesthesia Transfer of Care Note  Patient: Masashi Snowdon  Procedure(s) Performed: ARTHROSCOPY SHOULDER (Left: Shoulder) ARTHROSCOPIC BANKART REPAIR (Left: Shoulder)  Patient Location: PACU  Anesthesia Type:GA combined with regional for post-op pain  Level of Consciousness: awake, alert  and oriented  Airway & Oxygen Therapy: Patient Spontanous Breathing and Patient connected to face mask oxygen  Post-op Assessment: Report given to RN and Post -op Vital signs reviewed and stable  Post vital signs: Reviewed and stable  Last Vitals:  Vitals Value Taken Time  BP 121/89 03/05/22 1112  Temp    Pulse 71 03/05/22 1114  Resp 24 03/05/22 1114  SpO2 100 % 03/05/22 1114  Vitals shown include unvalidated device data.  Last Pain:  Vitals:   03/05/22 0853  TempSrc: Oral  PainSc: 0-No pain      Patients Stated Pain Goal: 3 (76/22/63 3354)  Complications: No notable events documented.

## 2022-03-05 NOTE — Anesthesia Postprocedure Evaluation (Signed)
Anesthesia Post Note  Patient: Steven Cross  Procedure(s) Performed: ARTHROSCOPY SHOULDER (Left: Shoulder) ARTHROSCOPIC BANKART REPAIR (Left: Shoulder)     Patient location during evaluation: PACU Anesthesia Type: Regional and General Level of consciousness: awake Pain management: pain level controlled Vital Signs Assessment: post-procedure vital signs reviewed and stable Respiratory status: spontaneous breathing, nonlabored ventilation, respiratory function stable and patient connected to nasal cannula oxygen Cardiovascular status: blood pressure returned to baseline and stable Postop Assessment: no apparent nausea or vomiting Anesthetic complications: no   No notable events documented.  Last Vitals:  Vitals:   03/05/22 1112 03/05/22 1138  BP: 121/89 115/69  Pulse: 78 61  Resp: (!) 21 20  Temp: (!) 36.4 C 36.6 C  SpO2: 100% 94%    Last Pain:  Vitals:   03/05/22 1146  TempSrc:   PainSc: 2                  Jalyn Dutta P Kimberlee Shoun

## 2022-03-05 NOTE — Op Note (Signed)
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  Successful completion of the planned procedure.  Patient will hold on therapy for 6 weeks, he is at high risk of recurrent instability as his tissue quality was quite thin and patulous.   Post-operative plan: The patient will be non-weightbearing in a sling for 6 weeks with PT to start after.  The patient will be discharged home.  DVT prophylaxis not indicated in ambulatory upper extremity patient without known risk factors.   Pain control with PRN pain medication preferring oral medicines.  Follow up plan will be scheduled in approximately 7 days for incision check.  Post-Op Diagnosis: Same Surgeons:Primary: Hiram Gash, MD Assistants:Caroline McBane PA-C Location: [facility] OR ROOM 6 Anesthesia: General with Exparel interscalene block Antibiotics: Ancef 2 g Tourniquet time: None Estimated Blood Loss: Minimal Complications: None Specimens: None Implants: Implant Name Type Inv. Item Serial No. Manufacturer Lot No. LRB No. Used Action  ANCHOR SUT 1.8 FBRTK KNTLS 2SU - [id number] Anchor ANCHOR SUT 1.8 FBRTK KNTLS 2SU  [employer] [id number] Left 1 Implanted  ANCHOR SUT 1.8 FIBERTAK SB KL - [id number] Anchor ANCHOR SUT 1.8 FIBERTAK SB KL  [employer] [id number] Left 1 Implanted  ANCHOR SUT 1.8 FIBERTAK SB KL - [id number] Anchor ANCHOR SUT 1.8 FIBERTAK SB KL  [employer] [id number] Left 1 Implanted  ANCHOR SUT 1.8 FIBERTAK SB KL  - [id number] Anchor ANCHOR SUT 1.8 FIBERTAK SB KL  [employer] [id number] Left 1 Implanted  ANCHOR SUT 1.8 FIBERTAK SB KL - [id number] Anchor ANCHOR SUT 1.8 FIBERTAK SB KL  [employer] [id number] Left 1 Implanted  ANCHOR SUT 1.8 FIBERTAK SB KL - [id number] Anchor ANCHOR SUT 1.8 [employer] [id number] Left 1 Implanted    Indications for Surgery:   Steven Cross is a 21 y.o. male with shoulder instability failing non-operative management and at risk of continued instability.  We discussed options including continued rehab versus surgery.  Family and patient understand the nature of postop recovery.  The risks and benefits were explained at length including but not limited to continued pain, cuff failure, continued instability, pain, hardware malfunction, infection and stiffness were all discussed.   Procedure:   Patient was correctly identified in the preoperative holding area and operative site marked.  Patient brought to OR and positioned lateral on a beanbag with an arthrex lateral positioner.  Anesthesia was induced and the operative shoulder was prepped and draped in the usual sterile fashion.  Timeout was called preincision.  We began by making our portals including an anterior inferior portal just above the subscap by placing a spinal needle then switching stick and placing a cannula.  We made an anterior accessory portal just above this just below the biceps.  Once these were performed formed we switched the camera to the anterior portal and were able to place a posterior superior and posterior inferior portal.  The posterior inferior portal was made with a percutaneous kit made by [employer].  Once portals were made we began by assessing  our tissue.  Findings are above.  We mobilized the labral tear extensively.    This point we began by placing anchors.  We started at the 6 o'clock position and placed a knotless 1.8 mm Fibertak anchor shuttling sutures in typical fashion obtaining  good purchase of the capsule labral tissue.  We repeated this process moving anteriorly placing 5 anchors between 4:00 and 9:00 good purchase of the tissue was obtained the tissue quality was reasonable but patulous.  The head was centered at the finish of the case.   The incisions were closed with absorbable monocryl and steri strips.  A sterile dressing was placed along with a sling. The patient was awoken from general anesthesia and taken to the PACU in stable condition without complication.   Noemi Chapel, PA-C, present and scrubbed throughout the case, critical for completion in a timely fashion, and for retraction, instrumentation, closure.

## 2022-03-05 NOTE — Anesthesia Procedure Notes (Signed)
Anesthesia Regional Block: Interscalene brachial plexus block   Pre-Anesthetic Checklist: , timeout performed,  Correct Patient, Correct Site, Correct Laterality,  Correct Procedure, Correct Position, site marked,  Risks and benefits discussed,  Surgical consent,  Pre-op evaluation,  At surgeon's request and post-op pain management  Laterality: Left  Prep: chloraprep       Needles:  Injection technique: Single-shot  Needle Type: Echogenic Stimulator Needle     Needle Length: 9cm  Needle Gauge: 21     Additional Needles:   Procedures:,,,, ultrasound used (permanent image in chart),,    Narrative:  Start time: 03/05/2022 9:30 AM End time: 03/05/2022 9:40 AM Injection made incrementally with aspirations every 5 mL.  Performed by: Personally  Anesthesiologist: Murvin Natal, MD  Additional Notes: Functioning IV was confirmed and monitors were applied.  A timeout was performed. Sterile prep, hand hygiene and sterile gloves were used. A 19mm 21ga Arrow echogenic stimulator needle was used. Negative aspiration and negative test dose prior to incremental administration of local anesthetic. The patient tolerated the procedure well.  Ultrasound guidance: relevent anatomy identified, needle position confirmed, local anesthetic spread visualized around nerve(s), vascular puncture avoided.  Image printed for medical record.

## 2022-03-05 NOTE — Anesthesia Preprocedure Evaluation (Addendum)
Anesthesia Evaluation  Patient identified by MRN, date of birth, ID band Patient awake    Reviewed: Allergy & Precautions, NPO status , Patient's Chart, lab work & pertinent test results  Airway Mallampati: II  TM Distance: >3 FB Neck ROM: Full    Dental no notable dental hx.    Pulmonary neg pulmonary ROS,    Pulmonary exam normal        Cardiovascular negative cardio ROS Normal cardiovascular exam     Neuro/Psych Anxiety negative neurological ROS     GI/Hepatic Neg liver ROS, GERD  Controlled,  Endo/Other  negative endocrine ROS  Renal/GU negative Renal ROS     Musculoskeletal negative musculoskeletal ROS (+)   Abdominal   Peds  Hematology negative hematology ROS (+)   Anesthesia Other Findings LEFT SHOULDER CARTILAGE DISORDER, INSTABILITY  Reproductive/Obstetrics                            Anesthesia Physical Anesthesia Plan  ASA: 1  Anesthesia Plan: General and Regional   Post-op Pain Management: Regional block*   Induction: Intravenous  PONV Risk Score and Plan: 2 and Ondansetron, Dexamethasone, Midazolam and Treatment may vary due to age or medical condition  Airway Management Planned: LMA  Additional Equipment:   Intra-op Plan:   Post-operative Plan: Extubation in OR  Informed Consent: I have reviewed the patients History and Physical, chart, labs and discussed the procedure including the risks, benefits and alternatives for the proposed anesthesia with the patient or authorized representative who has indicated his/her understanding and acceptance.     Dental advisory given  Plan Discussed with: CRNA  Anesthesia Plan Comments:         Anesthesia Quick Evaluation

## 2022-03-06 ENCOUNTER — Other Ambulatory Visit (HOSPITAL_COMMUNITY): Payer: Self-pay

## 2022-03-06 ENCOUNTER — Encounter (HOSPITAL_BASED_OUTPATIENT_CLINIC_OR_DEPARTMENT_OTHER): Payer: Self-pay | Admitting: Orthopaedic Surgery

## 2022-03-15 DIAGNOSIS — Z419 Encounter for procedure for purposes other than remedying health state, unspecified: Secondary | ICD-10-CM | POA: Diagnosis not present

## 2022-03-19 ENCOUNTER — Other Ambulatory Visit: Payer: Self-pay | Admitting: Physician Assistant

## 2022-03-19 NOTE — Telephone Encounter (Signed)
Patient needs an appointment for future refills. 

## 2022-04-15 DIAGNOSIS — Z419 Encounter for procedure for purposes other than remedying health state, unspecified: Secondary | ICD-10-CM | POA: Diagnosis not present

## 2022-04-22 ENCOUNTER — Ambulatory Visit: Payer: Medicaid Other | Attending: Orthopaedic Surgery | Admitting: Physical Therapy

## 2022-04-22 ENCOUNTER — Encounter: Payer: Self-pay | Admitting: Physical Therapy

## 2022-04-22 ENCOUNTER — Other Ambulatory Visit: Payer: Self-pay

## 2022-04-22 DIAGNOSIS — R600 Localized edema: Secondary | ICD-10-CM | POA: Insufficient documentation

## 2022-04-22 DIAGNOSIS — M6281 Muscle weakness (generalized): Secondary | ICD-10-CM | POA: Diagnosis not present

## 2022-04-22 DIAGNOSIS — M25512 Pain in left shoulder: Secondary | ICD-10-CM | POA: Diagnosis not present

## 2022-04-22 NOTE — Therapy (Signed)
OUTPATIENT PHYSICAL THERAPY SHOULDER EVALUATION   Patient Name: Steven Cross MRN: [id number] DOB:2001-01-29, 21 y.o., male Today's Date: 04/22/2022   PT End of Session - 04/22/22 1655     Visit Number 1    Number of Visits --   1-2x/week   Date for PT Re-Evaluation 06/17/22    Authorization Type well[employer]    PT Start Time 1700    PT Stop Time 1730    PT Time Calculation (min) 30 min             Past Medical History:  Diagnosis Date   Anxiety    GERD (gastroesophageal reflux disease)    Past Surgical History:  Procedure Laterality Date   BANKART REPAIR Left 03/05/2022   Procedure: ARTHROSCOPIC BANKART REPAIR;  Surgeon: Bjorn Pippin, MD;  Location: [facility];  Service: Orthopedics;  Laterality: Left;   SHOULDER ARTHROSCOPY Left 03/05/2022   Procedure: ARTHROSCOPY SHOULDER;  Surgeon: Bjorn Pippin, MD;  Location:  SUR[facility]ERY [facility];  Service: Orthopedics;  Laterality: Left;   Patient Active Problem List   Diagnosis Date Noted   GERD without esophagitis 12/13/2020    PCP: Pcp, No  REFERRING PROVIDER: Bjorn Pippin, MD  THERAPY DIAG:  Left shoulder pain, unspecified chronicity  Muscle weakness  Localized edema  REFERRING DIAG: Labral repair and capsulorrhaphy on 9/21    Rationale for Evaluation and Treatment:  Rehabilitation  SUBJECTIVE:  PERTINENT PAST HISTORY:  L labral repair and capsulorrhaphy on 9/21        PRECAUTIONS:   2 weeks 03/19/2022  4 weeks 04/02/2022  6 weeks 04/16/2022  8 weeks 04/30/2022  10 weeks 05/14/2022  12 weeks 05/28/2022      WEIGHT BEARING RESTRICTIONS Yes see above  FALLS:  Has patient fallen in last 6 months? No, Number of falls: 0  MOI/History of condition:  Onset date: 03/05/2022  SUBJECTIVE STATEMENT  Steven Cross is a 21 y.o. male who presents to clinic with chief complaint of pt reports that he dislocated his L shoulder while playing football last year.  This caused significant pain  following, but did not dislocate again.  He underwent L labral repair and capsulorrhaphy as well as debridement on 9/21.  He was in a sling for 6 weeks.  He is now out of the sling.  He has performed some light AAROM and pendulums since that time.    Red flags:  denies   Pain:  Are you having pain? Yes Pain location: anterior shoulder NPRS scale:  current 1/10  average 2/10  Aggravating factors: movement  NPRS, highest: 6/10 Relieving factors: rest  NPRS: best: 0/10 Pain description: aching Stage: Subacute Stability: getting better 24 hour pattern: NA   Occupation: Licensed conveyancer Device: NA  Hand Dominance: R  Patient Goals/Specific Activities: return to football next year, "get back to previous level"   OBJECTIVE:    DIAGNOSTIC FINDINGS:  NA    SENSATION:  Light touch: Appears intact   UPPER EXTREMITY AROM:  ROM Right 04/22/2022 Left 04/22/2022  Shoulder flexion 160 90  Shoulder abduction 160 65  Shoulder internal rotation    Shoulder external rotation    Functional IR    Functional ER    Shoulder extension    Elbow extension    Elbow flexion     (Blank rows = not tested, N = WNL, * = concordant pain with testing)  UPPER EXTREMITY MMT:  MMT Right 04/22/2022 Left 04/22/2022  Shoulder flexion  Shoulder abduction (C5)    Shoulder ER    Shoulder IR    Middle trapezius    Lower trapezius    Shoulder extension    Grip strength    Cervical flexion (C1,C2)    Cervical S/B (C3)    Shoulder shrug (C4)    Elbow flexion (C6)    Elbow ext (C7)    Thumb ext (C8)    Finger abd (T1)    Grossly     (Blank rows = not tested, score listed is out of 5 possible points.  N = WNL, D = diminished, C = clear for gross weakness with myotome testing, * = concordant pain with testing)     UPPER EXTREMITY PROM:  PROM Right 04/22/2022 Left 04/22/2022  Shoulder flexion N 90  Shoulder abduction N nt  Shoulder internal rotation    Shoulder external rotation   15 degrees at neutral  Functional IR    Functional ER    Shoulder extension    Elbow extension    Elbow flexion     (Blank rows = not tested, N = WNL, * = concordant pain with testing)   PATIENT SURVEYS:  Quick Dash 41    TODAY'S TREATMENT:  Creating, reviewing, and completing below HEP   PATIENT EDUCATION:  POC, diagnosis, prognosis, HEP, and outcome measures.  Pt educated via explanation, demonstration, and handout (HEP).  Pt confirms understanding verbally.   HOME EXERCISE PROGRAM: Access Code: [email or web address] URL: [email or web address] Date: 04/22/2022 Prepared by: Steven Cross  Exercises - Seated Shoulder Flexion Towel Slide at Table Top  - 1 x daily - 7 x weekly - 3 sets - 10 reps - Standing Isometric Shoulder External Rotation with Doorway  - 3 x daily - 7 x weekly - 1 sets - 10 reps - 5 hold - Supine Shoulder Flexion Extension AAROM with Dowel  - 1 x daily - 7 x weekly - 3 sets - 10 reps - Seated Shoulder External Rotation AAROM with Dowel  - 1 x daily - 7 x weekly - 3 sets - 10 reps  ASTERISK SIGNS   Asterisk Signs Eval (04/22/2022)       Shoulder flexion AROM 90 degrees       Shoulder ER 15 @ neutral                                 ASSESSMENT:  CLINICAL IMPRESSION: Steven Cross is a 21 y.o. male who presents to clinic with signs and sxs consistent with L shoulder pain w/ reduced ROM following L labral repair and capsulorrhaphy on 9/21.    OBJECTIVE IMPAIRMENTS: Pain, shoulder ROM, shoulder strength  ACTIVITY LIMITATIONS: sport, housework, lifting, reaching  PERSONAL FACTORS: See medical history and pertinent history   REHAB POTENTIAL: Good  CLINICAL DECISION MAKING: Stable/uncomplicated  EVALUATION COMPLEXITY: Low   GOALS:   SHORT TERM GOALS: Target date: 05/20/2022  Steven Cross will be >75% HEP compliant to improve carryover between sessions and facilitate independent management of condition  Evaluation (04/22/2022): ongoing Goal status:  INITIAL   LONG TERM GOALS: Target date: 06/17/2022  Steven Cross will show a >/= 21 pt improvement in his QUICK DASH score (MCID is 13.4 pts or 8%) as a proxy for functional improvement   Evaluation/Baseline (04/22/2022): 41 pts Goal status: INITIAL   2.  Steven Cross will demonstrate >150 degrees of active ROM in flexion to allow completion of activities involving reaching OH, not limited by  pain  Evaluation/Baseline (04/22/2022): 90 degrees Goal status: INITIAL    3.  Steven Cross will achieve >= 60 degrees of shoulder ER at 90 degrees of abduction to allow proper shoulder mechanics during OH movement, not limited by pain   Evaluation/Baseline (04/22/2022): 15 degrees in neutral Goal status: INITIAL   4.  Steven Cross will improve the following MMTs to >/= 4/5 to show improvement in strength:  shoulder flexion/abd/ER/IR   Evaluation/Baseline (04/22/2022): not tested d/t precautions Goal status: INITIAL   5.  Steven Cross will report confidence in self management of condition at time of discharge with advanced HEP  Evaluation/Baseline (04/22/2022): unable to self manage Goal status: INITIAL     PLAN: PT FREQUENCY: 1-2x/week  PT DURATION: 8 weeks (Ending 06/17/2022)  PLANNED INTERVENTIONS: Therapeutic exercises, Aquatic therapy, Therapeutic activity, Neuro Muscular re-education, Gait training, Patient/Family education, Joint mobilization, Dry Needling, Electrical stimulation, Spinal mobilization and/or manipulation, Moist heat, Taping, Vasopneumatic device, Ionotophoresis 4mg /ml Dexamethasone, and Manual therapy  PLAN FOR NEXT SESSION: progressive shoulder ROM and strengthening as appropriate   PT, DPT 04/22/2022, 5:39 PM  I just finished a MCD eval/recert.  Name: Steven Cross  MRN: Minna Merritts Please request 2x/week for 8 weeks.  Check all conditions that are expected to impact treatment: Musculoskeletal disorders   Check all possible CPT codes: [id number]- Therapeutic Exercise, [phone]-  Neuro Re-education, [phone] - Gait Training, [phone] - Manual Therapy, 97530 - Therapeutic Activities, 97535 - Self Care, [phone] - Re-evaluation, and [id number] - Mechanical traction

## 2022-04-29 ENCOUNTER — Ambulatory Visit: Payer: Medicaid Other

## 2022-04-29 DIAGNOSIS — R600 Localized edema: Secondary | ICD-10-CM | POA: Diagnosis not present

## 2022-04-29 DIAGNOSIS — M6281 Muscle weakness (generalized): Secondary | ICD-10-CM | POA: Diagnosis not present

## 2022-04-29 DIAGNOSIS — M25512 Pain in left shoulder: Secondary | ICD-10-CM | POA: Diagnosis not present

## 2022-04-29 NOTE — Therapy (Signed)
OUTPATIENT PHYSICAL THERAPY TREATMENT NOTE   Patient Name: Steven Cross MRN: [id number] DOB:2000-10-29, 21 y.o., male Today's Date: 04/29/2022  PCP: Pcp, No  REFERRING PROVIDER: Bjorn Pippin, MD   END OF SESSION:   PT End of Session - 04/29/22 1700     Visit Number 2    Date for PT Re-Evaluation 06/17/22    Authorization Type [employer]    PT Start Time 1700    PT Stop Time 1740    PT Time Calculation (min) 40 min    Activity Tolerance Patient tolerated treatment well    Behavior During Therapy WFL for tasks assessed/performed             Past Medical History:  Diagnosis Date   Anxiety    GERD (gastroesophageal reflux disease)    Past Surgical History:  Procedure Laterality Date   BANKART REPAIR Left 03/05/2022   Procedure: ARTHROSCOPIC BANKART REPAIR;  Surgeon: Bjorn Pippin, MD;  Location: [facility];  Service: Orthopedics;  Laterality: Left;   SHOULDER ARTHROSCOPY Left 03/05/2022   Procedure: ARTHROSCOPY SHOULDER;  Surgeon: Bjorn Pippin, MD;  Location: [facility];  Service: Orthopedics;  Laterality: Left;   Patient Active Problem List   Diagnosis Date Noted   GERD without esophagitis 12/13/2020    REFERRING DIAG: Labral repair and capsulorrhaphy on 9/21     THERAPY DIAG:  Left shoulder pain, unspecified chronicity  Muscle weakness  Localized edema  Rationale for Evaluation and Treatment Rehabilitation  PERTINENT HISTORY: L labral repair and capsulorrhaphy on 9/21    Onset date: 03/05/2022   PRECAUTIONS:  2 weeks 03/19/2022  4 weeks 04/02/2022  6 weeks 04/16/2022  8 weeks 04/30/2022  10 weeks 05/14/2022  12 weeks 05/28/2022        SUBJECTIVE:                                                                                                                                                                                      SUBJECTIVE STATEMENT:  Patient reports mild pain in anterior shoulder and UE.   PAIN:  Are  you having pain? Yes Pain location: anterior shoulder NPRS scale:  current 3/10  average 2/10  Aggravating factors: movement           NPRS, highest: 6/10 Relieving factors: rest           NPRS: best: 0/10 Pain description: aching Stage: Subacute Stability: getting better 24 hour pattern: NA    OBJECTIVE: (objective measures completed at initial evaluation unless otherwise dated)    DIAGNOSTIC FINDINGS:  NA  SENSATION:          Light touch: Appears intact     UPPER EXTREMITY AROM:   ROM Right 04/22/2022 Left 04/22/2022  Shoulder flexion 160 90  Shoulder abduction 160 65  Shoulder internal rotation      Shoulder external rotation      Functional IR      Functional ER      Shoulder extension      Elbow extension      Elbow flexion        (Blank rows = not tested, N = WNL, * = concordant pain with testing)   UPPER EXTREMITY MMT:   MMT Right 04/22/2022 Left 04/22/2022  Shoulder flexion      Shoulder abduction (C5)      Shoulder ER      Shoulder IR      Middle trapezius      Lower trapezius      Shoulder extension      Grip strength      Cervical flexion (C1,C2)      Cervical S/B (C3)      Shoulder shrug (C4)      Elbow flexion (C6)      Elbow ext (C7)      Thumb ext (C8)      Finger abd (T1)      Grossly        (Blank rows = not tested, score listed is out of 5 possible points.  N = WNL, D = diminished, C = clear for gross weakness with myotome testing, * = concordant pain with testing)                UPPER EXTREMITY PROM:   PROM Right 04/22/2022 Left 04/22/2022  Shoulder flexion N 90  Shoulder abduction N nt  Shoulder internal rotation      Shoulder external rotation   15 degrees at neutral  Functional IR      Functional ER      Shoulder extension      Elbow extension      Elbow flexion        (Blank rows = not tested, N = WNL, * = concordant pain with testing)     PATIENT SURVEYS:  Quick Dash 41              TODAY'S  TREATMENT:  Creating, reviewing, and completing below HEP     PATIENT EDUCATION:  POC, diagnosis, prognosis, HEP, and outcome measures.  Pt educated via explanation, demonstration, and handout (HEP).  Pt confirms understanding verbally.    HOME EXERCISE PROGRAM: Access Code: [email or web address] URL: [email or web address] Date: 04/22/2022 Prepared by: Alphonzo Severance   Exercises - Seated Shoulder Flexion Towel Slide at Table Top  - 1 x daily - 7 x weekly - 3 sets - 10 reps - Standing Isometric Shoulder External Rotation with Doorway  - 3 x daily - 7 x weekly - 1 sets - 10 reps - 5 hold - Supine Shoulder Flexion Extension AAROM with Dowel  - 1 x daily - 7 x weekly - 3 sets - 10 reps - Seated Shoulder External Rotation AAROM with Dowel  - 1 x daily - 7 x weekly - 3 sets - 10 reps   TREATMENT 04/29/2022: Therapeutic Exercise: Seated scapular retraction 3x10 Supine shoulder flexion/extension AAROM with dowel 3x10 Seated shoulder ER with dowel 3x10 Supine chest press (tennis ball) 3x10 Supine shoulder flexion (tennis ball) 2x10 Supine elbow flexion/extension (tennis ball) 3x10 Manual Therapy:  PROM all directions, gentle stretching at end range, x10 mins  ASTERISK SIGNS     Asterisk Signs Eval (04/22/2022)            Shoulder flexion AROM 90 degrees            Shoulder ER 15 @ neutral                                                             ASSESSMENT:   CLINICAL IMPRESSION: Patient presents to PT with mild pain in his Lt shoulder and reports HEP compliance. Session today focused on AAROM to AROM as well as PROM with stretching at end ranges. Patient was able to tolerate all prescribed exercises with no adverse effects. Patient continues to benefit from skilled PT services and should be progressed as able to improve functional independence.    OBJECTIVE IMPAIRMENTS: Pain, shoulder ROM, shoulder strength   ACTIVITY LIMITATIONS: sport, housework, lifting, reaching    PERSONAL FACTORS: See medical history and pertinent history     REHAB POTENTIAL: Good   CLINICAL DECISION MAKING: Stable/uncomplicated   EVALUATION COMPLEXITY: Low     GOALS:     SHORT TERM GOALS: Target date: 05/20/2022   Maude will be >75% HEP compliant to improve carryover between sessions and facilitate independent management of condition   Evaluation (04/22/2022): ongoing Goal status: INITIAL     LONG TERM GOALS: Target date: 06/17/2022   Thaddus will show a >/= 21 pt improvement in his QUICK DASH score (MCID is 13.4 pts or 8%) as a proxy for functional improvement    Evaluation/Baseline (04/22/2022): 41 pts Goal status: INITIAL     2.  Kellis will demonstrate >150 degrees of active ROM in flexion to allow completion of activities involving reaching OH, not limited by pain   Evaluation/Baseline (04/22/2022): 90 degrees Goal status: INITIAL       3.  Kaysan will achieve >= 60 degrees of shoulder ER at 90 degrees of abduction to allow proper shoulder mechanics during OH movement, not limited by pain    Evaluation/Baseline (04/22/2022): 15 degrees in neutral Goal status: INITIAL     4.  Paola will improve the following MMTs to >/= 4/5 to show improvement in strength:  shoulder flexion/abd/ER/IR    Evaluation/Baseline (04/22/2022): not tested d/t precautions Goal status: INITIAL     5.  Ranard will report confidence in self management of condition at time of discharge with advanced HEP   Evaluation/Baseline (04/22/2022): unable to self manage Goal status: INITIAL         PLAN: PT FREQUENCY: 1-2x/week   PT DURATION: 8 weeks (Ending 06/17/2022)   PLANNED INTERVENTIONS: Therapeutic exercises, Aquatic therapy, Therapeutic activity, Neuro Muscular re-education, Gait training, Patient/Family education, Joint mobilization, Dry Needling, Electrical stimulation, Spinal mobilization and/or manipulation, Moist heat, Taping, Vasopneumatic device, Ionotophoresis 4mg /ml  Dexamethasone, and Manual therapy   PLAN FOR NEXT SESSION: progressive shoulder ROM and strengthening as appropriate   , PTA 04/29/2022, 5:42 PM

## 2022-04-30 ENCOUNTER — Ambulatory Visit: Payer: Medicaid Other

## 2022-04-30 DIAGNOSIS — R600 Localized edema: Secondary | ICD-10-CM

## 2022-04-30 DIAGNOSIS — M25512 Pain in left shoulder: Secondary | ICD-10-CM

## 2022-04-30 DIAGNOSIS — M6281 Muscle weakness (generalized): Secondary | ICD-10-CM

## 2022-04-30 NOTE — Therapy (Deleted)
OUTPATIENT PHYSICAL THERAPY TREATMENT NOTE   Patient Name: Steven Cross MRN: [id number] DOB:February 25, 2001, 21 y.o., male Today's Date: 04/30/2022  PCP: Pcp, No  REFERRING PROVIDER: Hiram Gash, MD   END OF SESSION:     Past Medical History:  Diagnosis Date   Anxiety    GERD (gastroesophageal reflux disease)    Past Surgical History:  Procedure Laterality Date   BANKART REPAIR Left 03/05/2022   Procedure: ARTHROSCOPIC BANKART REPAIR;  Surgeon: Hiram Gash, MD;  Location: [facility];  Service: Orthopedics;  Laterality: Left;   SHOULDER ARTHROSCOPY Left 03/05/2022   Procedure: ARTHROSCOPY SHOULDER;  Surgeon: Hiram Gash, MD;  Location: [facility];  Service: Orthopedics;  Laterality: Left;   Patient Active Problem List   Diagnosis Date Noted   GERD without esophagitis 12/13/2020    REFERRING DIAG: Labral repair and capsulorrhaphy on 9/21     THERAPY DIAG:  No diagnosis found.  Rationale for Evaluation and Treatment Rehabilitation  PERTINENT HISTORY: L labral repair and capsulorrhaphy on 9/21    Onset date: 03/05/2022   PRECAUTIONS:  2 weeks 03/19/2022  4 weeks 04/02/2022  6 weeks 04/16/2022  8 weeks 04/30/2022  10 weeks 05/14/2022  12 weeks 05/28/2022        SUBJECTIVE:                                                                                                                                                                                      SUBJECTIVE STATEMENT:  Patient reports mild pain in anterior shoulder and UE.   PAIN:  Are you having pain? Yes Pain location: anterior shoulder NPRS scale:  current 3/10  average 2/10  Aggravating factors: movement           NPRS, highest: 6/10 Relieving factors: rest           NPRS: best: 0/10 Pain description: aching Stage: Subacute Stability: getting better 24 hour pattern: NA    OBJECTIVE: (objective measures completed at initial evaluation unless otherwise  dated)    DIAGNOSTIC FINDINGS:  NA                      SENSATION:          Light touch: Appears intact     UPPER EXTREMITY AROM:   ROM Right 04/22/2022 Left 04/22/2022  Shoulder flexion 160 90  Shoulder abduction 160 65  Shoulder internal rotation      Shoulder external rotation      Functional IR      Functional ER      Shoulder extension      Elbow extension  Elbow flexion        (Blank rows = not tested, N = WNL, * = concordant pain with testing)   UPPER EXTREMITY MMT:   MMT Right 04/22/2022 Left 04/22/2022  Shoulder flexion      Shoulder abduction (C5)      Shoulder ER      Shoulder IR      Middle trapezius      Lower trapezius      Shoulder extension      Grip strength      Cervical flexion (C1,C2)      Cervical S/B (C3)      Shoulder shrug (C4)      Elbow flexion (C6)      Elbow ext (C7)      Thumb ext (C8)      Finger abd (T1)      Grossly        (Blank rows = not tested, score listed is out of 5 possible points.  N = WNL, D = diminished, C = clear for gross weakness with myotome testing, * = concordant pain with testing)                UPPER EXTREMITY PROM:   PROM Right 04/22/2022 Left 04/22/2022  Shoulder flexion N 90  Shoulder abduction N nt  Shoulder internal rotation      Shoulder external rotation   15 degrees at neutral  Functional IR      Functional ER      Shoulder extension      Elbow extension      Elbow flexion        (Blank rows = not tested, N = WNL, * = concordant pain with testing)     PATIENT SURVEYS:  Quick Dash 41              TODAY'S TREATMENT:  Creating, reviewing, and completing below HEP     PATIENT EDUCATION:  POC, diagnosis, prognosis, HEP, and outcome measures.  Pt educated via explanation, demonstration, and handout (HEP).  Pt confirms understanding verbally.    HOME EXERCISE PROGRAM: Access Code: [email or web address] URL: [email or web address] Date: 04/22/2022 Prepared by: Alphonzo Severance    Exercises - Seated Shoulder Flexion Towel Slide at Table Top  - 1 x daily - 7 x weekly - 3 sets - 10 reps - Standing Isometric Shoulder External Rotation with Doorway  - 3 x daily - 7 x weekly - 1 sets - 10 reps - 5 hold - Supine Shoulder Flexion Extension AAROM with Dowel  - 1 x daily - 7 x weekly - 3 sets - 10 reps - Seated Shoulder External Rotation AAROM with Dowel  - 1 x daily - 7 x weekly - 3 sets - 10 reps   TREATMENT 04/29/2022: Therapeutic Exercise: Seated scapular retraction 3x10 Supine shoulder flexion/extension AAROM with dowel 3x10 Seated shoulder ER with dowel 3x10 Supine chest press (tennis ball) 3x10 Supine shoulder flexion (tennis ball) 2x10 Supine elbow flexion/extension (tennis ball) 3x10 Manual Therapy: PROM all directions, gentle stretching at end range, x10 mins  ASTERISK SIGNS     Asterisk Signs Eval (04/22/2022)            Shoulder flexion AROM 90 degrees            Shoulder ER 15 @ neutral  ASSESSMENT:   CLINICAL IMPRESSION: Patient presents to PT with mild pain in his Lt shoulder and reports HEP compliance. Session today focused on AAROM to AROM as well as PROM with stretching at end ranges. Patient was able to tolerate all prescribed exercises with no adverse effects. Patient continues to benefit from skilled PT services and should be progressed as able to improve functional independence.    OBJECTIVE IMPAIRMENTS: Pain, shoulder ROM, shoulder strength   ACTIVITY LIMITATIONS: sport, housework, lifting, reaching   PERSONAL FACTORS: See medical history and pertinent history     REHAB POTENTIAL: Good   CLINICAL DECISION MAKING: Stable/uncomplicated   EVALUATION COMPLEXITY: Low     GOALS:     SHORT TERM GOALS: Target date: 05/20/2022   Konor will be >75% HEP compliant to improve carryover between sessions and facilitate independent management of condition   Evaluation (04/22/2022):  ongoing Goal status: INITIAL     LONG TERM GOALS: Target date: 06/17/2022   Kamarrion will show a >/= 21 pt improvement in his QUICK DASH score (MCID is 13.4 pts or 8%) as a proxy for functional improvement    Evaluation/Baseline (04/22/2022): 41 pts Goal status: INITIAL     2.  Ade will demonstrate >150 degrees of active ROM in flexion to allow completion of activities involving reaching OH, not limited by pain   Evaluation/Baseline (04/22/2022): 90 degrees Goal status: INITIAL       3.  Carless will achieve >= 60 degrees of shoulder ER at 90 degrees of abduction to allow proper shoulder mechanics during OH movement, not limited by pain    Evaluation/Baseline (04/22/2022): 15 degrees in neutral Goal status: INITIAL     4.  Rochell will improve the following MMTs to >/= 4/5 to show improvement in strength:  shoulder flexion/abd/ER/IR    Evaluation/Baseline (04/22/2022): not tested d/t precautions Goal status: INITIAL     5.  Jaremiah will report confidence in self management of condition at time of discharge with advanced HEP   Evaluation/Baseline (04/22/2022): unable to self manage Goal status: INITIAL         PLAN: PT FREQUENCY: 1-2x/week   PT DURATION: 8 weeks (Ending 06/17/2022)   PLANNED INTERVENTIONS: Therapeutic exercises, Aquatic therapy, Therapeutic activity, Neuro Muscular re-education, Gait training, Patient/Family education, Joint mobilization, Dry Needling, Electrical stimulation, Spinal mobilization and/or manipulation, Moist heat, Taping, Vasopneumatic device, Ionotophoresis 4mg /ml Dexamethasone, and Manual therapy   PLAN FOR NEXT SESSION: progressive shoulder ROM and strengthening as appropriate   Lanice Shirts, PT 04/30/2022, 4:15 PM

## 2022-04-30 NOTE — Therapy (Signed)
OUTPATIENT PHYSICAL THERAPY TREATMENT NOTE   Patient Name: Steven Cross MRN: [id number] DOB:Oct 16, 2000, 21 y.o., male Today's Date: 04/30/2022  PCP: Oneita Hurt, No  REFERRING PROVIDER: Bjorn Pippin, MD   END OF SESSION:   PT End of Session - 04/30/22 1738     Visit Number 3    Date for PT Re-Evaluation 06/17/22    Authorization Type [employer]    PT Start Time 1745    PT Stop Time 1825    PT Time Calculation (min) 40 min    Activity Tolerance Patient tolerated treatment well    Behavior During Therapy WFL for tasks assessed/performed              Past Medical History:  Diagnosis Date   Anxiety    GERD (gastroesophageal reflux disease)    Past Surgical History:  Procedure Laterality Date   BANKART REPAIR Left 03/05/2022   Procedure: ARTHROSCOPIC BANKART REPAIR;  Surgeon: Bjorn Pippin, MD;  Location: [facility];  Service: Orthopedics;  Laterality: Left;   SHOULDER ARTHROSCOPY Left 03/05/2022   Procedure: ARTHROSCOPY SHOULDER;  Surgeon: Bjorn Pippin, MD;  Location: [facility];  Service: Orthopedics;  Laterality: Left;   Patient Active Problem List   Diagnosis Date Noted   GERD without esophagitis 12/13/2020    REFERRING DIAG: Labral repair and capsulorrhaphy on 9/21     THERAPY DIAG:  Left shoulder pain, unspecified chronicity  Muscle weakness  Localized edema  Rationale for Evaluation and Treatment Rehabilitation  PERTINENT HISTORY: L labral repair and capsulorrhaphy on 9/21    Onset date: 03/05/2022   PRECAUTIONS:  2 weeks 03/19/2022  4 weeks 04/02/2022  6 weeks 04/16/2022  8 weeks 04/30/2022  10 weeks 05/14/2022  12 weeks 05/28/2022        SUBJECTIVE:                                                                                                                                                                                      SUBJECTIVE STATEMENT: Patient reports some soreness after last session but that it did not  linger.    PAIN:  Are you having pain? Yes Pain location: anterior shoulder NPRS scale:  current 0/10  average 2/10  Aggravating factors: movement           NPRS, highest: 6/10 Relieving factors: rest           NPRS: best: 0/10 Pain description: aching Stage: Subacute Stability: getting better 24 hour pattern: NA    OBJECTIVE: (objective measures completed at initial evaluation unless otherwise dated)    DIAGNOSTIC FINDINGS:  NA  SENSATION:          Light touch: Appears intact     UPPER EXTREMITY AROM:   ROM Right 04/22/2022 Left 04/22/2022  Shoulder flexion 160 90  Shoulder abduction 160 65  Shoulder internal rotation      Shoulder external rotation      Functional IR      Functional ER      Shoulder extension      Elbow extension      Elbow flexion        (Blank rows = not tested, N = WNL, * = concordant pain with testing)   UPPER EXTREMITY MMT:   MMT Right 04/22/2022 Left 04/22/2022  Shoulder flexion      Shoulder abduction (C5)      Shoulder ER      Shoulder IR      Middle trapezius      Lower trapezius      Shoulder extension      Grip strength      Cervical flexion (C1,C2)      Cervical S/B (C3)      Shoulder shrug (C4)      Elbow flexion (C6)      Elbow ext (C7)      Thumb ext (C8)      Finger abd (T1)      Grossly        (Blank rows = not tested, score listed is out of 5 possible points.  N = WNL, D = diminished, C = clear for gross weakness with myotome testing, * = concordant pain with testing)                UPPER EXTREMITY PROM:   PROM Right 04/22/2022 Left 04/22/2022  Shoulder flexion N 90  Shoulder abduction N nt  Shoulder internal rotation      Shoulder external rotation   15 degrees at neutral  Functional IR      Functional ER      Shoulder extension      Elbow extension      Elbow flexion        (Blank rows = not tested, N = WNL, * = concordant pain with testing)     PATIENT SURVEYS:  Quick Dash  41              TODAY'S TREATMENT:  Creating, reviewing, and completing below HEP     PATIENT EDUCATION:  POC, diagnosis, prognosis, HEP, and outcome measures.  Pt educated via explanation, demonstration, and handout (HEP).  Pt confirms understanding verbally.    HOME EXERCISE PROGRAM: Access Code: [email or web address] URL: [email or web address] Date: 04/22/2022 Prepared by: Alphonzo Severance   Exercises - Seated Shoulder Flexion Towel Slide at Table Top  - 1 x daily - 7 x weekly - 3 sets - 10 reps - Standing Isometric Shoulder External Rotation with Doorway  - 3 x daily - 7 x weekly - 1 sets - 10 reps - 5 hold - Supine Shoulder Flexion Extension AAROM with Dowel  - 1 x daily - 7 x weekly - 3 sets - 10 reps - Seated Shoulder External Rotation AAROM with Dowel  - 1 x daily - 7 x weekly - 3 sets - 10 reps  TREATMENT 04/30/2022: Therapeutic Exercise: Rows YTB 2x10 Shoulder extension YTB 2x10 Seated shoulder ER with dowel 3x10 Sidelying ER (500g ball) 2x10 Supine chest press (500g balll) 2x10 Supine shoulder flexion (500g ball) 2x10 Supine elbow flexion/extension (500g ball) 2x10 pronated/supinated Prone  flexion (500g ball) x10 (difficult) Prone extension (500g ball) 2x10 Prone horizontal abduction (500g ball) 2x10 Manual Therapy: PROM all directions, gentle stretching at end range, x10 mins    TREATMENT 04/29/2022: Therapeutic Exercise: Seated scapular retraction 3x10 Supine shoulder flexion/extension AAROM with dowel 3x10 Seated shoulder ER with dowel 3x10 Supine chest press (tennis ball) 3x10 Supine shoulder flexion (tennis ball) 2x10 Supine elbow flexion/extension (tennis ball) 3x10 Manual Therapy: PROM all directions, gentle stretching at end range, x10 mins  ASTERISK SIGNS     Asterisk Signs Eval (04/22/2022)            Shoulder flexion AROM 90 degrees            Shoulder ER 15 @ neutral                                                              ASSESSMENT:   CLINICAL IMPRESSION: Patient presents to PT with no current pain in his Lt shoulder and reports some soreness after last session. Session today progressed to light resistance band and light weighted motions to good affect with minimal increase in pain throughout session to 3-4/10. Patient was able to tolerate all prescribed exercises with no adverse effects. Patient continues to benefit from skilled PT services and should be progressed as able to improve functional independence.   OBJECTIVE IMPAIRMENTS: Pain, shoulder ROM, shoulder strength   ACTIVITY LIMITATIONS: sport, housework, lifting, reaching   PERSONAL FACTORS: See medical history and pertinent history     REHAB POTENTIAL: Good   CLINICAL DECISION MAKING: Stable/uncomplicated   EVALUATION COMPLEXITY: Low     GOALS:     SHORT TERM GOALS: Target date: 05/20/2022   Murl will be >75% HEP compliant to improve carryover between sessions and facilitate independent management of condition   Evaluation (04/22/2022): ongoing Goal status: INITIAL     LONG TERM GOALS: Target date: 06/17/2022   Kahle will show a >/= 21 pt improvement in his QUICK DASH score (MCID is 13.4 pts or 8%) as a proxy for functional improvement    Evaluation/Baseline (04/22/2022): 41 pts Goal status: INITIAL     2.  Orville will demonstrate >150 degrees of active ROM in flexion to allow completion of activities involving reaching OH, not limited by pain   Evaluation/Baseline (04/22/2022): 90 degrees Goal status: INITIAL       3.  Cashius will achieve >= 60 degrees of shoulder ER at 90 degrees of abduction to allow proper shoulder mechanics during OH movement, not limited by pain    Evaluation/Baseline (04/22/2022): 15 degrees in neutral Goal status: INITIAL     4.  Ewart will improve the following MMTs to >/= 4/5 to show improvement in strength:  shoulder flexion/abd/ER/IR    Evaluation/Baseline (04/22/2022): not tested d/t  precautions Goal status: INITIAL     5.  Dartagnan will report confidence in self management of condition at time of discharge with advanced HEP   Evaluation/Baseline (04/22/2022): unable to self manage Goal status: INITIAL         PLAN: PT FREQUENCY: 1-2x/week   PT DURATION: 8 weeks (Ending 06/17/2022)   PLANNED INTERVENTIONS: Therapeutic exercises, Aquatic therapy, Therapeutic activity, Neuro Muscular re-education, Gait training, Patient/Family education, Joint mobilization, Dry Needling, Electrical stimulation, Spinal mobilization and/or manipulation, Moist heat, Taping, Vasopneumatic  device, Ionotophoresis 4mg /ml Dexamethasone, and Manual therapy   PLAN FOR NEXT SESSION: progressive shoulder ROM and strengthening as appropriate   Berta MinorStephanie Williams, PTA 04/30/2022, 6:25 PM

## 2022-05-05 ENCOUNTER — Encounter: Payer: Self-pay | Admitting: Physical Therapy

## 2022-05-05 ENCOUNTER — Ambulatory Visit: Payer: Medicaid Other | Admitting: Physical Therapy

## 2022-05-05 DIAGNOSIS — R600 Localized edema: Secondary | ICD-10-CM | POA: Diagnosis not present

## 2022-05-05 DIAGNOSIS — M25512 Pain in left shoulder: Secondary | ICD-10-CM

## 2022-05-05 DIAGNOSIS — M6281 Muscle weakness (generalized): Secondary | ICD-10-CM

## 2022-05-05 NOTE — Therapy (Signed)
OUTPATIENT PHYSICAL THERAPY TREATMENT NOTE   Patient Name: Steven Cross MRN: [id number] DOB:Jan 25, 2001, 21 y.o., male Today's Date: 05/05/2022  PCP: Oneita Hurt, No  REFERRING PROVIDER: Bjorn Pippin, MD   END OF SESSION:   PT End of Session - 05/05/22 1748     Visit Number 4    Date for PT Re-Evaluation 06/17/22    Authorization Type [employer]    PT Start Time 1748    PT Stop Time 1828    PT Time Calculation (min) 40 min    Activity Tolerance Patient tolerated treatment well    Behavior During Therapy WFL for tasks assessed/performed              Past Medical History:  Diagnosis Date   Anxiety    GERD (gastroesophageal reflux disease)    Past Surgical History:  Procedure Laterality Date   BANKART REPAIR Left 03/05/2022   Procedure: ARTHROSCOPIC BANKART REPAIR;  Surgeon: Bjorn Pippin, MD;  Location: [facility];  Service: Orthopedics;  Laterality: Left;   SHOULDER ARTHROSCOPY Left 03/05/2022   Procedure: ARTHROSCOPY SHOULDER;  Surgeon: Bjorn Pippin, MD;  Location: [facility];  Service: Orthopedics;  Laterality: Left;   Patient Active Problem List   Diagnosis Date Noted   GERD without esophagitis 12/13/2020    REFERRING DIAG: Labral repair and capsulorrhaphy on 9/21     THERAPY DIAG:  Left shoulder pain, unspecified chronicity  Muscle weakness  Localized edema  Rationale for Evaluation and Treatment Rehabilitation  PERTINENT HISTORY: L labral repair and capsulorrhaphy on 9/21    Onset date: 03/05/2022   PRECAUTIONS:  2 weeks 03/19/2022  4 weeks 04/02/2022  6 weeks 04/16/2022  8 weeks 04/30/2022  10 weeks 05/14/2022  12 weeks 05/28/2022        SUBJECTIVE:                                                                                                                                                                                      SUBJECTIVE STATEMENT: Pt reports that overall he is doing well and has minimal pain right  now.   PAIN:  Are you having pain? Yes Pain location: anterior shoulder NPRS scale:  current 0/10  average 2/10  Aggravating factors: movement           NPRS, highest: 3/10 Relieving factors: rest Pain description: aching Stage: Subacute 24 hour pattern: NA    OBJECTIVE: (objective measures completed at initial evaluation unless otherwise dated)    DIAGNOSTIC FINDINGS:  NA                      SENSATION:  Light touch: Appears intact     UPPER EXTREMITY AROM:   ROM Right 04/22/2022 Left 04/22/2022  Shoulder flexion 160 90  Shoulder abduction 160 65  Shoulder internal rotation      Shoulder external rotation      Functional IR      Functional ER      Shoulder extension      Elbow extension      Elbow flexion        (Blank rows = not tested, N = WNL, * = concordant pain with testing)   UPPER EXTREMITY MMT:   MMT Right 04/22/2022 Left 04/22/2022  Shoulder flexion      Shoulder abduction (C5)      Shoulder ER      Shoulder IR      Middle trapezius      Lower trapezius      Shoulder extension      Grip strength      Cervical flexion (C1,C2)      Cervical S/B (C3)      Shoulder shrug (C4)      Elbow flexion (C6)      Elbow ext (C7)      Thumb ext (C8)      Finger abd (T1)      Grossly        (Blank rows = not tested, score listed is out of 5 possible points.  N = WNL, D = diminished, C = clear for gross weakness with myotome testing, * = concordant pain with testing)                UPPER EXTREMITY PROM:   PROM Right 04/22/2022 Left 04/22/2022  Shoulder flexion N 90  Shoulder abduction N nt  Shoulder internal rotation      Shoulder external rotation   15 degrees at neutral  Functional IR      Functional ER      Shoulder extension      Elbow extension      Elbow flexion        (Blank rows = not tested, N = WNL, * = concordant pain with testing)     PATIENT SURVEYS:  Quick Dash 41              TODAY'S TREATMENT:  Creating,  reviewing, and completing below HEP      HOME EXERCISE PROGRAM: Access Code: [email or web address] URL: [email or web address] Date: 05/05/2022 Prepared by: Alphonzo Severance  Exercises - Seated Shoulder Flexion AAROM with Dowel  - 1 x daily - 7 x weekly - 3 sets - 10 reps - Seated Shoulder External Rotation AAROM with Cane and Hand in Neutral  - 1 x daily - 7 x weekly - 3 sets - 10 reps - Standing shoulder flexion wall slides  - 1 x daily - 7 x weekly - 3 sets - 1 reps - Sidelying Shoulder External Rotation  - 1 x daily - 7 x weekly - 3 sets - 10 reps   ASTERISK SIGNS     Asterisk Signs Eval (04/22/2022)  11/21          Shoulder flexion AROM 90 degrees 100           Shoulder ER 15 @ neutral  25 @ neutral           PROM    130  TREATMENT 05/05/2022: Therapeutic Exercise: UBE L2 2.5/2.5 UE ranger 30'' - 20x flexion and scaption Pulley - 20x flexion with mirror AAROM forward OH press to 90 degrees - with mirror - 3x20 Towel slide - 3x15 S/L ER -  3x15 - 3x15 S/L abduction to 90 - 3x15  Manual Therapy: PROM flexion and gentle ER    TREATMENT 04/29/2022: Therapeutic Exercise: Seated scapular retraction 3x10 Supine shoulder flexion/extension AAROM with dowel 3x10 Seated shoulder ER with dowel 3x10 Supine chest press (tennis ball) 3x10 Supine shoulder flexion (tennis ball) 2x10 Supine elbow flexion/extension (tennis ball) 3x10 Manual Therapy: PROM all directions, gentle stretching at end range, x10 mins  ASSESSMENT:   CLINICAL IMPRESSION: Aziah tolerated session well with no adverse reaction.  His ROM is improving significantly but he does have a strong shrug compensation with OH movement which is somewhat improved with visual cuing.  This will likely improve with cuff strengthening.  HEP updated.  OBJECTIVE IMPAIRMENTS: Pain, shoulder ROM, shoulder strength   ACTIVITY LIMITATIONS: sport, housework, lifting, reaching    PERSxONAL FACTORS: See medical history and pertinent history     REHAB POTENTIAL: Good   CLINICAL DECISION MAKING: Stable/uncomplicated   EVALUATION COMPLEXITY: Low     GOALS:     SHORT TERM GOALS: Target date: 05/20/2022   Harsha will be >75% HEP compliant to improve carryover between sessions and facilitate independent management of condition   Evaluation (04/22/2022): ongoing Goal status: INITIAL     LONG TERM GOALS: Target date: 06/17/2022   Icker will show a >/= 21 pt improvement in his QUICK DASH score (MCID is 13.4 pts or 8%) as a proxy for functional improvement    Evaluation/Baseline (04/22/2022): 41 pts Goal status: INITIAL     2.  Jeanpierre will demonstrate >150 degrees of active ROM in flexion to allow completion of activities involving reaching OH, not limited by pain   Evaluation/Baseline (04/22/2022): 90 degrees Goal status: INITIAL       3.  Hadi will achieve >= 60 degrees of shoulder ER at 90 degrees of abduction to allow proper shoulder mechanics during OH movement, not limited by pain    Evaluation/Baseline (04/22/2022): 15 degrees in neutral Goal status: INITIAL     4.  Kathryn will improve the following MMTs to >/= 4/5 to show improvement in strength:  shoulder flexion/abd/ER/IR    Evaluation/Baseline (04/22/2022): not tested d/t precautions Goal status: INITIAL     5.  Kacey will report confidence in self management of condition at time of discharge with advanced HEP   Evaluation/Baseline (04/22/2022): unable to self manage Goal status: INITIAL         PLAN: PT FREQUENCY: 1-2x/week   PT DURATION: 8 weeks (Ending 06/17/2022)   PLANNED INTERVENTIONS: Therapeutic exercises, Aquatic therapy, Therapeutic activity, Neuro Muscular re-education, Gait training, Patient/Family education, Joint mobilization, Dry Needling, Electrical stimulation, Spinal mobilization and/or manipulation, Moist heat, Taping, Vasopneumatic device, Ionotophoresis 4mg /ml  Dexamethasone, and Manual therapy   PLAN FOR NEXT SESSION: progressive shoulder ROM and strengthening as appropriate   , PT 05/05/2022, 6:27 PM

## 2022-05-06 ENCOUNTER — Ambulatory Visit: Payer: Medicaid Other | Admitting: Physical Therapy

## 2022-05-06 ENCOUNTER — Encounter: Payer: Self-pay | Admitting: Physical Therapy

## 2022-05-06 DIAGNOSIS — M25512 Pain in left shoulder: Secondary | ICD-10-CM

## 2022-05-06 DIAGNOSIS — R600 Localized edema: Secondary | ICD-10-CM | POA: Diagnosis not present

## 2022-05-06 DIAGNOSIS — M6281 Muscle weakness (generalized): Secondary | ICD-10-CM | POA: Diagnosis not present

## 2022-05-06 NOTE — Therapy (Signed)
OUTPATIENT PHYSICAL THERAPY TREATMENT NOTE   Patient Name: Steven Cross MRN: [id number] DOB:[birth year], 21 y.o., male Today's Date: 05/06/2022  PCP: Steven Cross, No  REFERRING PROVIDER: Bjorn Pippin, MD   END OF SESSION:   PT End of Session - 05/06/22 1658     Visit Number 5    Number of Visits 9    Date for PT Re-Evaluation 06/17/22    Authorization Type Approved 8 visits 04/23/22-06/22/22    PT Start Time 1700    PT Stop Time 1741    PT Time Calculation (min) 41 min    Activity Tolerance Patient tolerated treatment well    Behavior During Therapy WFL for tasks assessed/performed              Past Medical History:  Diagnosis Date   Anxiety    GERD (gastroesophageal reflux disease)    Past Surgical History:  Procedure Laterality Date   BANKART REPAIR Left 03/05/2022   Procedure: ARTHROSCOPIC BANKART REPAIR;  Surgeon: Steven Pippin, MD;  Location: [facility];  Service: Orthopedics;  Laterality: Left;   SHOULDER ARTHROSCOPY Left 03/05/2022   Procedure: ARTHROSCOPY SHOULDER;  Surgeon: Steven Pippin, MD;  Location: [facility];  Service: Orthopedics;  Laterality: Left;   Patient Active Problem List   Diagnosis Date Noted   GERD without esophagitis 12/13/2020    REFERRING DIAG: Labral repair and capsulorrhaphy on 9/21     THERAPY DIAG:  Left shoulder pain, unspecified chronicity  Muscle weakness  Localized edema  Rationale for Evaluation and Treatment Rehabilitation  PERTINENT HISTORY: L labral repair and capsulorrhaphy on 9/21    Onset date: 03/05/2022   PRECAUTIONS:  2 weeks 03/19/2022  4 weeks 04/02/2022  6 weeks 04/16/2022  8 weeks 04/30/2022  10 weeks 05/14/2022  12 weeks 05/28/2022        SUBJECTIVE:                                                                                                                                                                                      SUBJECTIVE STATEMENT: Pt reports that  overall he is doing well and has minimal pain right now.   PAIN:  Are you having pain? Yes Pain location: anterior shoulder NPRS scale:  current 0/10  average 2/10  Aggravating factors: movement           NPRS, highest: 3/10 Relieving factors: rest Pain description: aching Stage: Subacute 24 hour pattern: NA    OBJECTIVE: (objective measures completed at initial evaluation unless otherwise dated)    DIAGNOSTIC FINDINGS:  NA  SENSATION:          Light touch: Appears intact     UPPER EXTREMITY AROM:   ROM Right 04/22/2022 Left 04/22/2022  Shoulder flexion 160 90  Shoulder abduction 160 65  Shoulder internal rotation      Shoulder external rotation      Functional IR      Functional ER      Shoulder extension      Elbow extension      Elbow flexion        (Blank rows = not tested, N = WNL, * = concordant pain with testing)   UPPER EXTREMITY MMT:   MMT Right 04/22/2022 Left 04/22/2022  Shoulder flexion      Shoulder abduction (C5)      Shoulder ER      Shoulder IR      Middle trapezius      Lower trapezius      Shoulder extension      Grip strength      Cervical flexion (C1,C2)      Cervical S/B (C3)      Shoulder shrug (C4)      Elbow flexion (C6)      Elbow ext (C7)      Thumb ext (C8)      Finger abd (T1)      Grossly        (Blank rows = not tested, score listed is out of 5 possible points.  N = WNL, D = diminished, C = clear for gross weakness with myotome testing, * = concordant pain with testing)                UPPER EXTREMITY PROM:   PROM Right 04/22/2022 Left 04/22/2022  Shoulder flexion N 90  Shoulder abduction N nt  Shoulder internal rotation      Shoulder external rotation   15 degrees at neutral  Functional IR      Functional ER      Shoulder extension      Elbow extension      Elbow flexion        (Blank rows = not tested, N = WNL, * = concordant pain with testing)     PATIENT SURVEYS:  Quick Dash 41               TODAY'S TREATMENT:  Creating, reviewing, and completing below HEP      HOME EXERCISE PROGRAM: Access Code: [email or web address] URL: [email or web address] Date: 05/05/2022 Prepared by: Steven Cross  Exercises - Seated Shoulder Flexion AAROM with Dowel  - 1 x daily - 7 x weekly - 3 sets - 10 reps - Seated Shoulder External Rotation AAROM with Cane and Hand in Neutral  - 1 x daily - 7 x weekly - 3 sets - 10 reps - Standing shoulder flexion wall slides  - 1 x daily - 7 x weekly - 3 sets - 1 reps - Sidelying Shoulder External Rotation  - 1 x daily - 7 x weekly - 3 sets - 10 reps   ASTERISK SIGNS     Asterisk Signs Eval (04/22/2022)  11/21  11/22        Shoulder flexion AROM 90 degrees 100           Shoulder ER 15 @ neutral  25 @ neutral           PROM    130  135  TREATMENT 05/06/2022: Therapeutic Exercise: UBE L2 2.5/2.5 Green pball - 3x10 chest press - supine Green pball flexion - supine - 3x10 SA punch - 4# - 3x10 SA punch - 4# - 2x20 cw/cc UE ranger 34'' - 20x flexion and scaption Scaption in mirror for symmetry - stopping before shrug. ER with YTB - 3x10 IR with GTB - 3x10 S/L ER -  3x15 - 1# S/L abduction to 90 - 3x15 - 1#  Manual Therapy: PROM flexion and gentle ER  TREATMENT 05/05/2022: Therapeutic Exercise: UBE L2 2.5/2.5 UE ranger 30'' - 20x flexion and scaption Pulley - 20x flexion with mirror AAROM forward OH press to 90 degrees - with mirror - 3x20 Towel slide - 3x15 S/L ER -  3x15 - 3x15 S/L abduction to 90 - 3x15  Manual Therapy: PROM flexion and gentle ER    TREATMENT 04/29/2022: Therapeutic Exercise: Seated scapular retraction 3x10 Supine shoulder flexion/extension AAROM with dowel 3x10 Seated shoulder ER with dowel 3x10 Supine chest press (tennis ball) 3x10 Supine shoulder flexion (tennis ball) 2x10 Supine elbow flexion/extension (tennis ball) 3x10 Manual Therapy: PROM all  directions, gentle stretching at end range, x10 mins  ASSESSMENT:   CLINICAL IMPRESSION: Steven Cross tolerated session well with no adverse reaction.  Continues to progress as expected with improved strength, endurance, and ROM.  Significant improvement in shrug sign today.  Continue per POC.  OBJECTIVE IMPAIRMENTS: Pain, shoulder ROM, shoulder strength   ACTIVITY LIMITATIONS: sport, housework, lifting, reaching   PERSxONAL FACTORS: See medical history and pertinent history     REHAB POTENTIAL: Good   CLINICAL DECISION MAKING: Stable/uncomplicated   EVALUATION COMPLEXITY: Low     GOALS:     SHORT TERM GOALS: Target date: 05/20/2022   Cornel will be >75% HEP compliant to improve carryover between sessions and facilitate independent management of condition   Evaluation (04/22/2022): ongoing Goal status: INITIAL     LONG TERM GOALS: Target date: 06/17/2022   Branton will show a >/= 21 pt improvement in his QUICK DASH score (MCID is 13.4 pts or 8%) as a proxy for functional improvement    Evaluation/Baseline (04/22/2022): 41 pts Goal status: INITIAL     2.  Cristian will demonstrate >150 degrees of active ROM in flexion to allow completion of activities involving reaching OH, not limited by pain   Evaluation/Baseline (04/22/2022): 90 degrees Goal status: INITIAL       3.  Bret will achieve >= 60 degrees of shoulder ER at 90 degrees of abduction to allow proper shoulder mechanics during OH movement, not limited by pain    Evaluation/Baseline (04/22/2022): 15 degrees in neutral Goal status: INITIAL     4.  Loron will improve the following MMTs to >/= 4/5 to show improvement in strength:  shoulder flexion/abd/ER/IR    Evaluation/Baseline (04/22/2022): not tested d/t precautions Goal status: INITIAL     5.  Treon will report confidence in self management of condition at time of discharge with advanced HEP   Evaluation/Baseline (04/22/2022): unable to self manage Goal status:  INITIAL         PLAN: PT FREQUENCY: 1-2x/week   PT DURATION: 8 weeks (Ending 06/17/2022)   PLANNED INTERVENTIONS: Therapeutic exercises, Aquatic therapy, Therapeutic activity, Neuro Muscular re-education, Gait training, Patient/Family education, Joint mobilization, Dry Needling, Electrical stimulation, Spinal mobilization and/or manipulation, Moist heat, Taping, Vasopneumatic device, Ionotophoresis 4mg /ml Dexamethasone, and Manual therapy   PLAN FOR NEXT SESSION: progressive shoulder ROM and strengthening as appropriate   , PT 05/06/2022, 5:49  PM

## 2022-05-12 ENCOUNTER — Encounter: Payer: Self-pay | Admitting: Physical Therapy

## 2022-05-12 ENCOUNTER — Ambulatory Visit: Payer: Medicaid Other | Admitting: Physical Therapy

## 2022-05-12 DIAGNOSIS — M25512 Pain in left shoulder: Secondary | ICD-10-CM

## 2022-05-12 DIAGNOSIS — R600 Localized edema: Secondary | ICD-10-CM

## 2022-05-12 DIAGNOSIS — M6281 Muscle weakness (generalized): Secondary | ICD-10-CM | POA: Diagnosis not present

## 2022-05-12 NOTE — Therapy (Signed)
OUTPATIENT PHYSICAL THERAPY TREATMENT NOTE   Patient Name: Steven Cross MRN: [id number] DOB:19-Nov-2000, 21 y.o., male Today's Date: 05/12/2022  PCP: Merryl Hacker, No  REFERRING PROVIDER: Hiram Gash, MD   END OF SESSION:   PT End of Session - 05/12/22 1745     Visit Number 6    Number of Visits 9    Date for PT Re-Evaluation 06/17/22    Authorization Type Approved 8 visits 04/23/22-06/22/22    PT Start Time 3762    PT Stop Time 1826    PT Time Calculation (min) 41 min    Activity Tolerance Patient tolerated treatment well    Behavior During Therapy WFL for tasks assessed/performed              Past Medical History:  Diagnosis Date   Anxiety    GERD (gastroesophageal reflux disease)    Past Surgical History:  Procedure Laterality Date   BANKART REPAIR Left 03/05/2022   Procedure: ARTHROSCOPIC BANKART REPAIR;  Surgeon: Hiram Gash, MD;  Location: [place];  Service: Orthopedics;  Laterality: Left;   SHOULDER ARTHROSCOPY Left 03/05/2022   Procedure: ARTHROSCOPY SHOULDER;  Surgeon: Hiram Gash, MD;  Location: [place];  Service: Orthopedics;  Laterality: Left;   Patient Active Problem List   Diagnosis Date Noted   GERD without esophagitis 12/13/2020    REFERRING DIAG: Labral repair and capsulorrhaphy on 9/21     THERAPY DIAG:  Left shoulder pain, unspecified chronicity  Muscle weakness  Localized edema  Rationale for Evaluation and Treatment Rehabilitation  PERTINENT HISTORY: L labral repair and capsulorrhaphy on 9/21    Onset date: 03/05/2022   PRECAUTIONS:  2 weeks 03/19/2022  4 weeks 04/02/2022  6 weeks 04/16/2022  8 weeks 04/30/2022  10 weeks 05/14/2022  12 weeks 05/28/2022        SUBJECTIVE:                                                                                                                                                                                      SUBJECTIVE STATEMENT: Pt reports that his  shoulder is improving and he feels like it is looser.   PAIN:  Are you having pain? Yes Pain location: anterior shoulder NPRS scale:  current 0/10  average 2/10  Aggravating factors: movement           NPRS, highest: 3/10 Relieving factors: rest Pain description: aching Stage: Subacute 24 hour pattern: NA    OBJECTIVE: (objective measures completed at initial evaluation unless otherwise dated)    DIAGNOSTIC FINDINGS:  NA  SENSATION:          Light touch: Appears intact     UPPER EXTREMITY AROM:   ROM Right 04/22/2022 Left 04/22/2022  Shoulder flexion 160 90  Shoulder abduction 160 65  Shoulder internal rotation      Shoulder external rotation      Functional IR      Functional ER      Shoulder extension      Elbow extension      Elbow flexion        (Blank rows = not tested, N = WNL, * = concordant pain with testing)   UPPER EXTREMITY MMT:   MMT Right 04/22/2022 Left 04/22/2022  Shoulder flexion      Shoulder abduction (C5)      Shoulder ER      Shoulder IR      Middle trapezius      Lower trapezius      Shoulder extension      Grip strength      Cervical flexion (C1,C2)      Cervical S/B (C3)      Shoulder shrug (C4)      Elbow flexion (C6)      Elbow ext (C7)      Thumb ext (C8)      Finger abd (T1)      Grossly        (Blank rows = not tested, score listed is out of 5 possible points.  N = WNL, D = diminished, C = clear for gross weakness with myotome testing, * = concordant pain with testing)                UPPER EXTREMITY PROM:   PROM Right 04/22/2022 Left 04/22/2022  Shoulder flexion N 90  Shoulder abduction N nt  Shoulder internal rotation      Shoulder external rotation   15 degrees at neutral  Functional IR      Functional ER      Shoulder extension      Elbow extension      Elbow flexion        (Blank rows = not tested, N = WNL, * = concordant pain with testing)     PATIENT SURVEYS:  Quick Dash 41               TODAY'S TREATMENT:  Creating, reviewing, and completing below HEP      HOME EXERCISE PROGRAM: Access Code: [email or web address] URL: [email or web address] Date: 05/12/2022 Prepared by: Shearon Balo  Exercises - Seated Shoulder Flexion AAROM with Dowel  - 1 x daily - 7 x weekly - 3 sets - 10 reps - Seated Shoulder External Rotation AAROM with Cane and Hand in Neutral  - 1 x daily - 7 x weekly - 3 sets - 10 reps - Standing shoulder flexion wall slides  - 1 x daily - 7 x weekly - 3 sets - 1 reps - Shoulder External Rotation with Anchored Resistance  - 1 x daily - 7 x weekly - 3 sets - 10 reps - Prone Shoulder Horizontal Abduction with Thumbs Up  - 1 x daily - 7 x weekly - 3 sets - 10 reps - Prone W Scapular Retraction  - 1 x daily - 7 x weekly - 3 sets - 10 reps   ASTERISK SIGNS     Asterisk Signs Eval (04/22/2022)  11/21  11/22        Shoulder flexion AROM 90 degrees 100  110      Shoulder ER 15 @ neutral  25 @ neutral           PROM    130  135  140                                         TREATMENT 05/12/2022: Therapeutic Exercise: UBE L5 2.5/2.5 Green pball flexion - supine - 3x10 Prone T, I, W - 3x10 ea Scaption at wall 2# - 3x10 SA punch - 4# - 2x20 cw/cc UE ranger 80'' - 20x flexion and scaption ER with GTB - 2x10 _0  2x15 IR with Black TB - 3x10 Shoulder abd circles on wall - 20x cw/cc   Manual Therapy: PROM flexion and gentle ER  TREATMENT 05/05/2022: Therapeutic Exercise: UBE L2 2.5/2.5 UE ranger 30'' - 20x flexion and scaption Pulley - 20x flexion with mirror AAROM forward OH press to 90 degrees - with mirror - 3x20 Towel slide - 3x15 S/L ER -  3x15 - 3x15 S/L abduction to 90 - 3x15  Manual Therapy: PROM flexion and gentle ER    TREATMENT 04/29/2022: Therapeutic Exercise: Seated scapular retraction 3x10 Supine shoulder flexion/extension AAROM with dowel 3x10 Seated shoulder ER with dowel 3x10 Supine chest press (tennis ball)  3x10 Supine shoulder flexion (tennis ball) 2x10 Supine elbow flexion/extension (tennis ball) 3x10 Manual Therapy: PROM all directions, gentle stretching at end range, x10 mins  ASSESSMENT:   CLINICAL IMPRESSION: Steven Cross tolerated session well with no adverse reaction.  Steven Cross continues to progress flexion ROM.  He does lack ROM in horizontal abduction, but this is expected given the anterior capsule tightening.  We will progress this slowly as tolerated.  OBJECTIVE IMPAIRMENTS: Pain, shoulder ROM, shoulder strength   ACTIVITY LIMITATIONS: sport, housework, lifting, reaching   [place]: See medical history and pertinent history     REHAB POTENTIAL: Good   CLINICAL DECISION MAKING: Stable/uncomplicated   EVALUATION COMPLEXITY: Low     GOALS:     SHORT TERM GOALS: Target date: 05/20/2022   Axtyn will be >75% HEP compliant to improve carryover between sessions and facilitate independent management of condition   Evaluation (04/22/2022): ongoing Goal status: MET     LONG TERM GOALS: Target date: 06/17/2022   Steven Cross will show a >/= 21 pt improvement in his QUICK DASH score (MCID is 13.4 pts or 8%) as a proxy for functional improvement    Evaluation/Baseline (04/22/2022): 41 pts Goal status: INITIAL     2.  Steven Cross will demonstrate >150 degrees of active ROM in flexion to allow completion of activities involving reaching OH, not limited by pain   Evaluation/Baseline (04/22/2022): 90 degrees Goal status: INITIAL       3.  Steven Cross will achieve >= 60 degrees of shoulder ER at 90 degrees of abduction to allow proper shoulder mechanics during OH movement, not limited by pain    Evaluation/Baseline (04/22/2022): 15 degrees in neutral Goal status: INITIAL     4.  Steven Cross will improve the following MMTs to >/= 4/5 to show improvement in strength:  shoulder flexion/abd/ER/IR    Evaluation/Baseline (04/22/2022): not tested d/t precautions Goal status: INITIAL     5.   Steven Cross will report confidence in self management of condition at time of discharge with advanced HEP   Evaluation/Baseline (04/22/2022): unable to self manage Goal status: INITIAL         PLAN:  PT FREQUENCY: 1-2x/week   PT DURATION: 8 weeks (Ending 06/17/2022)   PLANNED INTERVENTIONS: Therapeutic exercises, Aquatic therapy, Therapeutic activity, Neuro Muscular re-education, Gait training, Patient/Family education, Joint mobilization, Dry Needling, Electrical stimulation, Spinal mobilization and/or manipulation, Moist heat, Taping, Vasopneumatic device, Ionotophoresis 14m/ml Dexamethasone, and Manual therapy   PLAN FOR NEXT SESSION: progressive shoulder ROM and strengthening as appropriate   KMathis Dad PT 05/12/2022, 6:27 PM

## 2022-05-14 ENCOUNTER — Ambulatory Visit: Payer: Medicaid Other

## 2022-05-14 DIAGNOSIS — M6281 Muscle weakness (generalized): Secondary | ICD-10-CM

## 2022-05-14 DIAGNOSIS — R600 Localized edema: Secondary | ICD-10-CM | POA: Diagnosis not present

## 2022-05-14 DIAGNOSIS — M25512 Pain in left shoulder: Secondary | ICD-10-CM

## 2022-05-14 NOTE — Therapy (Signed)
OUTPATIENT PHYSICAL THERAPY TREATMENT NOTE   Patient Name: Steven Cross MRN: [id number] DOB:November 13, 2000, 21 y.o., male Today's Date: 05/14/2022  PCP: Pcp, No  REFERRING PROVIDER: Hiram Gash, MD   END OF SESSION:   PT End of Session - 05/14/22 1655     Visit Number 7    Number of Visits 9    Date for PT Re-Evaluation 06/17/22    Authorization Type Approved 8 visits 04/23/22-06/22/22    PT Start Time 1655    PT Stop Time 1735    PT Time Calculation (min) 40 min    Activity Tolerance Patient tolerated treatment well    Behavior During Therapy WFL for tasks assessed/performed               Past Medical History:  Diagnosis Date   Anxiety    GERD (gastroesophageal reflux disease)    Past Surgical History:  Procedure Laterality Date   BANKART REPAIR Left 03/05/2022   Procedure: ARTHROSCOPIC BANKART REPAIR;  Surgeon: Hiram Gash, MD;  Location: [facility];  Service: Orthopedics;  Laterality: Left;   SHOULDER ARTHROSCOPY Left 03/05/2022   Procedure: ARTHROSCOPY SHOULDER;  Surgeon: Hiram Gash, MD;  Location: [facility];  Service: Orthopedics;  Laterality: Left;   Patient Active Problem List   Diagnosis Date Noted   GERD without esophagitis 12/13/2020    REFERRING DIAG: Labral repair and capsulorrhaphy on 9/21     THERAPY DIAG:  Left shoulder pain, unspecified chronicity  Muscle weakness  Localized edema  Rationale for Evaluation and Treatment Rehabilitation  PERTINENT HISTORY: L labral repair and capsulorrhaphy on 9/21    Onset date: 03/05/2022   PRECAUTIONS:  2 weeks 03/19/2022  4 weeks 04/02/2022  6 weeks 04/16/2022  8 weeks 04/30/2022  10 weeks 05/14/2022  12 weeks 05/28/2022        SUBJECTIVE:                                                                                                                                                                                      SUBJECTIVE STATEMENT: Patient reports no  current pain.  PAIN:  Are you having pain? Yes Pain location: anterior shoulder NPRS scale:  current 0/10  average 2/10  Aggravating factors: movement           NPRS, highest: 3/10 Relieving factors: rest Pain description: aching Stage: Subacute 24 hour pattern: NA    OBJECTIVE: (objective measures completed at initial evaluation unless otherwise dated)    DIAGNOSTIC FINDINGS:  NA  SENSATION:          Light touch: Appears intact     UPPER EXTREMITY AROM:   ROM Right 04/22/2022 Left 04/22/2022  Shoulder flexion 160 90  Shoulder abduction 160 65  Shoulder internal rotation      Shoulder external rotation      Functional IR      Functional ER      Shoulder extension      Elbow extension      Elbow flexion        (Blank rows = not tested, N = WNL, * = concordant pain with testing)   UPPER EXTREMITY MMT:   MMT Right 04/22/2022 Left 04/22/2022  Shoulder flexion      Shoulder abduction (C5)      Shoulder ER      Shoulder IR      Middle trapezius      Lower trapezius      Shoulder extension      Grip strength      Cervical flexion (C1,C2)      Cervical S/B (C3)      Shoulder shrug (C4)      Elbow flexion (C6)      Elbow ext (C7)      Thumb ext (C8)      Finger abd (T1)      Grossly        (Blank rows = not tested, score listed is out of 5 possible points.  N = WNL, D = diminished, C = clear for gross weakness with myotome testing, * = concordant pain with testing)                UPPER EXTREMITY PROM:   PROM Right 04/22/2022 Left 04/22/2022  Shoulder flexion N 90  Shoulder abduction N nt  Shoulder internal rotation      Shoulder external rotation   15 degrees at neutral  Functional IR      Functional ER      Shoulder extension      Elbow extension      Elbow flexion        (Blank rows = not tested, N = WNL, * = concordant pain with testing)     PATIENT SURVEYS:  Quick Dash 41              TODAY'S TREATMENT:  Creating,  reviewing, and completing below HEP      HOME EXERCISE PROGRAM: Access Code: [email or web address] URL: [email or web address] Date: 05/12/2022 Prepared by: Shearon Balo  Exercises - Seated Shoulder Flexion AAROM with Dowel  - 1 x daily - 7 x weekly - 3 sets - 10 reps - Seated Shoulder External Rotation AAROM with Cane and Hand in Neutral  - 1 x daily - 7 x weekly - 3 sets - 10 reps - Standing shoulder flexion wall slides  - 1 x daily - 7 x weekly - 3 sets - 1 reps - Shoulder External Rotation with Anchored Resistance  - 1 x daily - 7 x weekly - 3 sets - 10 reps - Prone Shoulder Horizontal Abduction with Thumbs Up  - 1 x daily - 7 x weekly - 3 sets - 10 reps - Prone W Scapular Retraction  - 1 x daily - 7 x weekly - 3 sets - 10 reps   ASTERISK SIGNS     Asterisk Signs Eval (04/22/2022)  11/21  11/22        Shoulder flexion AROM 90 degrees 100  110      Shoulder ER 15 @ neutral  25 @ neutral           PROM    130  135  140                                      TREATMENT 05/14/22: Therapeutic Exercise: UBE L5 2.5/2.5 Green pball flexion - supine - 3x10 Prone T, I, W - 2x10 ea Scaption at wall 2# - 3x10 SA punch - 4# - x20 cw/cc UE ranger 34'' - 20x flexion and scaption ER with GTB - 2x10 _0  2x10 IR with Black TB - 3x10 Shoulder abd circles on wall - 20x cw/cc  Manual Therapy: PROM flexion and gentle ER  TREATMENT 05/12/2022: Therapeutic Exercise: UBE L5 2.5/2.5 Green pball flexion - supine - 3x10 Prone T, I, W - 3x10 ea Scaption at wall 2# - 3x10 SA punch - 4# - 2x20 cw/cc UE ranger 1'' - 20x flexion and scaption ER with GTB - 2x10 _1  2x15 IR with Black TB - 3x10 Shoulder abd circles on wall - 20x cw/cc   Manual Therapy: PROM flexion and gentle ER  TREATMENT 05/05/2022: Therapeutic Exercise: UBE L2 2.5/2.5 UE ranger 30'' - 20x flexion and scaption Pulley - 20x flexion with mirror AAROM forward OH press to 90 degrees - with mirror - 3x20 Towel  slide - 3x15 S/L ER -  3x15 - 3x15 S/L abduction to 90 - 3x15  Manual Therapy: PROM flexion and gentle ER     ASSESSMENT:   CLINICAL IMPRESSION: Patient presents to PT with no current pain and reports HEP compliance. Session today continued to focus on periscapular and RTC strengthening. Patient was able to tolerate all prescribed exercises with no adverse effects. Patient continues to benefit from skilled PT services and should be progressed as able to improve functional independence.   OBJECTIVE IMPAIRMENTS: Pain, shoulder ROM, shoulder strength   ACTIVITY LIMITATIONS: sport, housework, lifting, reaching   [place]: See medical history and pertinent history     REHAB POTENTIAL: Good   CLINICAL DECISION MAKING: Stable/uncomplicated   EVALUATION COMPLEXITY: Low     GOALS:     SHORT TERM GOALS: Target date: 05/20/2022   Pearlie will be >75% HEP compliant to improve carryover between sessions and facilitate independent management of condition   Evaluation (04/22/2022): ongoing Goal status: MET     LONG TERM GOALS: Target date: 06/17/2022   Kadrian will show a >/= 21 pt improvement in his QUICK DASH score (MCID is 13.4 pts or 8%) as a proxy for functional improvement    Evaluation/Baseline (04/22/2022): 41 pts Goal status: INITIAL     2.  Lonnell will demonstrate >150 degrees of active ROM in flexion to allow completion of activities involving reaching OH, not limited by pain   Evaluation/Baseline (04/22/2022): 90 degrees Goal status: INITIAL       3.  Xue will achieve >= 60 degrees of shoulder ER at 90 degrees of abduction to allow proper shoulder mechanics during OH movement, not limited by pain    Evaluation/Baseline (04/22/2022): 15 degrees in neutral Goal status: INITIAL     4.  Jashua will improve the following MMTs to >/= 4/5 to show improvement in strength:  shoulder flexion/abd/ER/IR    Evaluation/Baseline (04/22/2022): not tested d/t  precautions Goal status: INITIAL     5.  Sonia will report confidence in  self management of condition at time of discharge with advanced HEP   Evaluation/Baseline (04/22/2022): unable to self manage Goal status: INITIAL         PLAN: PT FREQUENCY: 1-2x/week   PT DURATION: 8 weeks (Ending 06/17/2022)   PLANNED INTERVENTIONS: Therapeutic exercises, Aquatic therapy, Therapeutic activity, Neuro Muscular re-education, Gait training, Patient/Family education, Joint mobilization, Dry Needling, Electrical stimulation, Spinal mobilization and/or manipulation, Moist heat, Taping, Vasopneumatic device, Ionotophoresis 38m/ml Dexamethasone, and Manual therapy   PLAN FOR NEXT SESSION: progressive shoulder ROM and strengthening as appropriate   SMargarette Canada PTA 05/14/2022, 4:56 PM

## 2022-05-15 DIAGNOSIS — Z419 Encounter for procedure for purposes other than remedying health state, unspecified: Secondary | ICD-10-CM | POA: Diagnosis not present

## 2022-05-20 ENCOUNTER — Encounter: Payer: Self-pay | Admitting: Physical Therapy

## 2022-05-20 ENCOUNTER — Ambulatory Visit: Payer: Medicaid Other | Attending: Orthopaedic Surgery | Admitting: Physical Therapy

## 2022-05-20 DIAGNOSIS — R600 Localized edema: Secondary | ICD-10-CM | POA: Insufficient documentation

## 2022-05-20 DIAGNOSIS — M25512 Pain in left shoulder: Secondary | ICD-10-CM | POA: Insufficient documentation

## 2022-05-20 DIAGNOSIS — M6281 Muscle weakness (generalized): Secondary | ICD-10-CM | POA: Insufficient documentation

## 2022-05-20 NOTE — Therapy (Signed)
OUTPATIENT PHYSICAL THERAPY TREATMENT NOTE   Patient Name: Steven Cross MRN: [id number] DOB:[birth year], 21 y.o., male Today's Date: 05/20/2022  PCP: Pcp, No  REFERRING PROVIDER: Hiram Gash, MD   END OF SESSION:   PT End of Session - 05/20/22 1653     Visit Number 8    Number of Visits 9    Date for PT Re-Evaluation 06/17/22    Authorization Type Approved 8 visits 04/23/22-06/22/22    PT Start Time 1700    PT Stop Time 1741    PT Time Calculation (min) 41 min    Activity Tolerance Patient tolerated treatment well    Behavior During Therapy WFL for tasks assessed/performed               Past Medical History:  Diagnosis Date   Anxiety    GERD (gastroesophageal reflux disease)    Past Surgical History:  Procedure Laterality Date   BANKART REPAIR Left 03/05/2022   Procedure: ARTHROSCOPIC BANKART REPAIR;  Surgeon: Hiram Gash, MD;  Location: [facility];  Service: Orthopedics;  Laterality: Left;   SHOULDER ARTHROSCOPY Left 03/05/2022   Procedure: ARTHROSCOPY SHOULDER;  Surgeon: Hiram Gash, MD;  Location: [facility];  Service: Orthopedics;  Laterality: Left;   Patient Active Problem List   Diagnosis Date Noted   GERD without esophagitis 12/13/2020    REFERRING DIAG: Labral repair and capsulorrhaphy on 9/21     THERAPY DIAG:  Left shoulder pain, unspecified chronicity  Muscle weakness  Localized edema  Rationale for Evaluation and Treatment Rehabilitation  PERTINENT HISTORY: L labral repair and capsulorrhaphy on 9/21    Onset date: 03/05/2022   PRECAUTIONS:  2 weeks 03/19/2022  4 weeks 04/02/2022  6 weeks 04/16/2022  8 weeks 04/30/2022  10 weeks 05/14/2022  12 weeks 05/28/2022        SUBJECTIVE:                                                                                                                                                                                      SUBJECTIVE STATEMENT: Pt reports that his  shoulder ROM has been improving.  He is having minimal pain.  PAIN:  Are you having pain? Yes Pain location: anterior shoulder NPRS scale:  current 0/10  average 1/10  Aggravating factors: movement Relieving factors: rest Pain description: aching Stage: Subacute 24 hour pattern: NA    OBJECTIVE: (objective measures completed at initial evaluation unless otherwise dated)    DIAGNOSTIC FINDINGS:  NA                      SENSATION:  Light touch: Appears intact     UPPER EXTREMITY AROM:   ROM Right 04/22/2022 Left 04/22/2022  Shoulder flexion 160 90  Shoulder abduction 160 65  Shoulder internal rotation      Shoulder external rotation      Functional IR      Functional ER      Shoulder extension      Elbow extension      Elbow flexion        (Blank rows = not tested, N = WNL, * = concordant pain with testing)   UPPER EXTREMITY MMT:   MMT Right 04/22/2022 Left 04/22/2022  Shoulder flexion      Shoulder abduction (C5)      Shoulder ER      Shoulder IR      Middle trapezius      Lower trapezius      Shoulder extension      Grip strength      Cervical flexion (C1,C2)      Cervical S/B (C3)      Shoulder shrug (C4)      Elbow flexion (C6)      Elbow ext (C7)      Thumb ext (C8)      Finger abd (T1)      Grossly        (Blank rows = not tested, score listed is out of 5 possible points.  N = WNL, D = diminished, C = clear for gross weakness with myotome testing, * = concordant pain with testing)                UPPER EXTREMITY PROM:   PROM Right 04/22/2022 Left 04/22/2022  Shoulder flexion N 90  Shoulder abduction N nt  Shoulder internal rotation      Shoulder external rotation   15 degrees at neutral  Functional IR      Functional ER      Shoulder extension      Elbow extension      Elbow flexion        (Blank rows = not tested, N = WNL, * = concordant pain with testing)     PATIENT SURVEYS:  Quick Dash 41              TODAY'S  TREATMENT:  Creating, reviewing, and completing below HEP      HOME EXERCISE PROGRAM: Access Code: [email or web address] URL: [email or web address] Date: 05/12/2022 Prepared by: Shearon Balo  Exercises - Seated Shoulder Flexion AAROM with Dowel  - 1 x daily - 7 x weekly - 3 sets - 10 reps - Seated Shoulder External Rotation AAROM with Cane and Hand in Neutral  - 1 x daily - 7 x weekly - 3 sets - 10 reps - Standing shoulder flexion wall slides  - 1 x daily - 7 x weekly - 3 sets - 1 reps - Shoulder External Rotation with Anchored Resistance  - 1 x daily - 7 x weekly - 3 sets - 10 reps - Prone Shoulder Horizontal Abduction with Thumbs Up  - 1 x daily - 7 x weekly - 3 sets - 10 reps - Prone W Scapular Retraction  - 1 x daily - 7 x weekly - 3 sets - 10 reps   ASTERISK SIGNS     Asterisk Signs Eval (04/22/2022)  11/21  11/22   12/6     Shoulder flexion AROM 90 degrees 100     110 127     Shoulder ER  15 @ neutral  25 @ neutral     Full ROM      PROM    130  135  140  142                                    TREATMENT 05/20/22:  Therapeutic Exercise: UBE L5 2.5/2.5 Horizontal abd with YTB - 3x10 - in supine Wall walk - RTB - 4x5 OH press at wall - 3x10 - 3# Prone T, Y, W - 2x10 ea Scaption at wall 2# - 3x10 UE ranger 66'' - 20x flexion and scaption  Manual Therapy: PROM flexion and gentle ER  TREATMENT 05/12/2022: Therapeutic Exercise: UBE L5 2.5/2.5 Green pball flexion - supine - 3x10 Prone T, I, W - 3x10 ea Scaption at wall 2# - 3x10 UE ranger 51'' - 20x flexion and scaption ER with GTB  _0  3x15 IR with Black TB - 3x10 Shoulder abd circles on wall - 20x cw/cc   Manual Therapy: PROM flexion and gentle ER  TREATMENT 05/05/2022: Therapeutic Exercise: UBE L2 2.5/2.5 UE ranger 30'' - 20x flexion and scaption Pulley - 20x flexion with mirror AAROM forward OH press to 90 degrees - with mirror - 3x20 Towel slide - 3x15 S/L ER -  3x15 - 3x15 S/L abduction to 90 -  3x15  Manual Therapy: PROM flexion and gentle ER     ASSESSMENT:   CLINICAL IMPRESSION: Patient presents to PT with no current pain and reports HEP compliance. Shrug with OH movement is nearly eliminated at this point.  His passive ROM of L shoulder is about 10 degrees lacking from R shoulder.  Concentration on cuff and periscapular strengthening today.  OBJECTIVE IMPAIRMENTS: Pain, shoulder ROM, shoulder strength   ACTIVITY LIMITATIONS: sport, housework, lifting, reaching   [place]: See medical history and pertinent history     REHAB POTENTIAL: Good   CLINICAL DECISION MAKING: Stable/uncomplicated   EVALUATION COMPLEXITY: Low     GOALS:     SHORT TERM GOALS: Target date: 05/20/2022   Jarmon will be >75% HEP compliant to improve carryover between sessions and facilitate independent management of condition   Evaluation (04/22/2022): ongoing Goal status: MET     LONG TERM GOALS: Target date: 06/17/2022   Eldon will show a >/= 21 pt improvement in his QUICK DASH score (MCID is 13.4 pts or 8%) as a proxy for functional improvement    Evaluation/Baseline (04/22/2022): 41 pts Goal status: INITIAL     2.  Jazz will demonstrate >150 degrees of active ROM in flexion to allow completion of activities involving reaching OH, not limited by pain   Evaluation/Baseline (04/22/2022): 90 degrees Goal status: INITIAL       3.  Kamau will achieve >= 60 degrees of shoulder ER at 90 degrees of abduction to allow proper shoulder mechanics during OH movement, not limited by pain    Evaluation/Baseline (04/22/2022): 15 degrees in neutral Goal status: INITIAL     4.  Kearney will improve the following MMTs to >/= 4/5 to show improvement in strength:  shoulder flexion/abd/ER/IR    Evaluation/Baseline (04/22/2022): not tested d/t precautions Goal status: INITIAL     5.  Markon will report confidence in self management of condition at time of discharge with advanced HEP    Evaluation/Baseline (04/22/2022): unable to self manage Goal status: INITIAL         PLAN: PT FREQUENCY: 1-2x/week  PT DURATION: 8 weeks (Ending 06/17/2022)   PLANNED INTERVENTIONS: Therapeutic exercises, Aquatic therapy, Therapeutic activity, Neuro Muscular re-education, Gait training, Patient/Family education, Joint mobilization, Dry Needling, Electrical stimulation, Spinal mobilization and/or manipulation, Moist heat, Taping, Vasopneumatic device, Ionotophoresis 33m/ml Dexamethasone, and Manual therapy   PLAN FOR NEXT SESSION: progressive shoulder ROM and strengthening as appropriate   KMathis Dad PT 05/20/2022, 5:43 PM

## 2022-05-27 ENCOUNTER — Encounter: Payer: Self-pay | Admitting: Physical Therapy

## 2022-05-27 ENCOUNTER — Ambulatory Visit: Payer: Medicaid Other | Admitting: Physical Therapy

## 2022-05-27 DIAGNOSIS — M6281 Muscle weakness (generalized): Secondary | ICD-10-CM

## 2022-05-27 DIAGNOSIS — R600 Localized edema: Secondary | ICD-10-CM | POA: Diagnosis not present

## 2022-05-27 DIAGNOSIS — M25512 Pain in left shoulder: Secondary | ICD-10-CM

## 2022-05-27 NOTE — Therapy (Signed)
OUTPATIENT PHYSICAL THERAPY TREATMENT NOTE   Patient Name: Steven Cross MRN: [id number] DOB:[birth year], 21 y.o., male Today's Date: 05/27/2022  PCP: Merryl Hacker, No  REFERRING PROVIDER: Hiram Gash, MD   END OF SESSION:   PT End of Session - 05/27/22 1657     Visit Number 9    Number of Visits 9    Date for PT Re-Evaluation 06/17/22    Authorization Type Approved 8 visits 04/23/22-06/22/22 - more visits requested 12/13    PT Start Time 1700    PT Stop Time 1741    PT Time Calculation (min) 41 min    Activity Tolerance Patient tolerated treatment well    Behavior During Therapy WFL for tasks assessed/performed               Past Medical History:  Diagnosis Date   Anxiety    GERD (gastroesophageal reflux disease)    Past Surgical History:  Procedure Laterality Date   BANKART REPAIR Left 03/05/2022   Procedure: ARTHROSCOPIC BANKART REPAIR;  Surgeon: Hiram Gash, MD;  Location: [place];  Service: Orthopedics;  Laterality: Left;   SHOULDER ARTHROSCOPY Left 03/05/2022   Procedure: ARTHROSCOPY SHOULDER;  Surgeon: Hiram Gash, MD;  Location: [place];  Service: Orthopedics;  Laterality: Left;   Patient Active Problem List   Diagnosis Date Noted   GERD without esophagitis 12/13/2020    REFERRING DIAG: Labral repair and capsulorrhaphy on 9/21     THERAPY DIAG:  Left shoulder pain, unspecified chronicity  Muscle weakness  Localized edema  Rationale for Evaluation and Treatment Rehabilitation  PERTINENT HISTORY: L labral repair and capsulorrhaphy on 9/21    Onset date: 03/05/2022   PRECAUTIONS:  2 weeks 03/19/2022  4 weeks 04/02/2022  6 weeks 04/16/2022  8 weeks 04/30/2022  10 weeks 05/14/2022  12 weeks 05/28/2022        SUBJECTIVE:                                                                                                                                                                                      SUBJECTIVE  STATEMENT: Pt reports that his shoulder continues to improve.  He feels he is doing well.  PAIN:  Are you having pain? Yes Pain location: anterior shoulder NPRS scale:  current 0/10  average 1/10  Aggravating factors: movement Relieving factors: rest Pain description: aching Stage: Subacute 24 hour pattern: NA    OBJECTIVE:    UPPER EXTREMITY MMT:   See goals                UPPER EXTREMITY PROM:   PROM Right 04/22/2022 Left 04/22/2022  L 12/13  Shoulder flexion N 90 AROM  145  Shoulder abduction N nt   Shoulder internal rotation       Shoulder external rotation   15 degrees at neutral   Functional IR       Functional ER       Shoulder extension       Elbow extension       Elbow flexion         (Blank rows = not tested, N = WNL, * = concordant pain with testing)     PATIENT SURVEYS:  Quick Dash 41              TODAY'S TREATMENT:  Creating, reviewing, and completing below HEP      HOME EXERCISE PROGRAM: Access Code: [email or web address] URL: [email or web address] Date: 05/27/2022 Prepared by: Shearon Balo  Exercises - Standing shoulder flexion wall slides  - 1 x daily - 7 x weekly - 3 sets - 1 reps - Shoulder External Rotation with Anchored Resistance  - 1 x daily - 7 x weekly - 3 sets - 10 reps - Wall Push Up  - 1 x daily - 7 x weekly - 3 sets - 10 reps - Prone Shoulder Horizontal Abduction with Thumbs Up  - 1 x daily - 7 x weekly - 3 sets - 10 reps - Prone W Scapular Retraction  - 1 x daily - 7 x weekly - 3 sets - 10 reps - Prone Scapular Retraction Y  - 1 x daily - 7 x weekly - 3 sets - 10 reps - Shoulder Flexion Serratus Activation with Resistance  - 1 x daily - 7 x weekly - 3 sets - 10 reps - Forearm Walks on Wall with Resistance Band  - 1 x daily - 7 x weekly - 3 sets - 10 reps   ASTERISK SIGNS     Asterisk Signs Eval (04/22/2022)  11/21  11/22   12/6   12/13  Shoulder flexion AROM 90 degrees 100     110 127   145  Shoulder ER 15 @  neutral  25 @ neutral     Full ROM      PROM    130  135  140  142  155                                  TREATMENT 05/27/22:  Therapeutic Exercise: UBE L6.5 2.5/2.5 Wall push up - 3x10 Body blade - 3x to failure Wall walk - RTB - 4x5 1/2 kneeling eccentric catch/concentric through - red ball - 2x10 Face pulls with OH press - RTB - 3x10 OH press at wall - 3x10 - 5# Prone T, Y, W - 2x10 ea Scaption at wall 2# - 3x10 UE ranger 24'' - 20x flexion and scaption  Manual Therapy: PROM flexion and gentle ER  TREATMENT 05/12/2022: Therapeutic Exercise: UBE L5 2.5/2.5 Green pball flexion - supine - 3x10 Prone T, I, W - 3x10 ea Scaption at wall 2# - 3x10 UE ranger 34'' - 20x flexion and scaption ER with GTB  _0  3x15 IR with Black TB - 3x10 Shoulder abd circles on wall - 20x cw/cc   Manual Therapy: PROM flexion and gentle ER  TREATMENT 05/05/2022: Therapeutic Exercise: UBE L2 2.5/2.5 UE ranger 30'' - 20x flexion and scaption Pulley - 20x flexion with mirror AAROM forward OH press to 90 degrees - with mirror -  3x20 Towel slide - 3x15 S/L ER -  3x15 - 3x15 S/L abduction to 90 - 3x15  Manual Therapy: PROM flexion and gentle ER     ASSESSMENT:   CLINICAL IMPRESSION: Glenden has progressed well with therapy.  Improved impairments include: shoulder ROM, shoulder strength.  Functional improvements include: ability to lift light weight, reach OH, and complete light housework with no pain.  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  Barriers to progress include: NA.   Please see GOALS section for progress on short term and long term goals established at evaluation.  I recommend continuation of PT to allow completion of remaining goals and continued functional progression.  OBJECTIVE IMPAIRMENTS: Pain, shoulder ROM, shoulder strength   ACTIVITY LIMITATIONS: sport, housework, lifting,  reaching   [place]: See medical history and pertinent history     REHAB POTENTIAL: Good   CLINICAL DECISION MAKING: Stable/uncomplicated   EVALUATION COMPLEXITY: Low     GOALS:     SHORT TERM GOALS: Target date: 05/20/2022   Maureen will be >75% HEP compliant to improve carryover between sessions and facilitate independent management of condition   Evaluation (04/22/2022): ongoing Goal status: MET     LONG TERM GOALS: Target date: 06/17/2022   Ritchie will show a >/= 21 pt improvement in his QUICK DASH score (MCID is 13.4 pts or 8%) as a proxy for functional improvement    Evaluation/Baseline (04/22/2022): 41 pts  12/13: QuickDASH Score: 29.5 / 100 = 29.5 % Goal status: Ongoing     2.  Dametri will demonstrate >150 degrees of active ROM in flexion to allow completion of activities involving reaching [place], not limited by pain   Evaluation/Baseline (04/22/2022): 90 degrees 12/13: 145 Goal status: Ongoing       3.  Lauris will achieve >= 60 degrees of shoulder ER at 90 degrees of abduction to allow proper shoulder mechanics during OH movement, not limited by pain    Evaluation/Baseline (04/22/2022): 15 degrees in neutral 12/13: MET Goal status: INITIAL     4.  Tushar will improve the following MMTs to >/= 4/5 to show improvement in strength:  shoulder flexion/abd/ER/IR    Evaluation/Baseline (04/22/2022): not tested d/t precautions 12/13: flexion: 4+/5 ER: 4/5 IR: 4/5 Goal status: INITIAL     5.  Normal will report confidence in self management of condition at time of discharge with advanced HEP   Evaluation/Baseline (04/22/2022): unable to self manage Goal status: ongoing         PLAN: PT FREQUENCY: 1-2x/week   PT DURATION: 8 weeks (Ending 06/17/2022)   PLANNED INTERVENTIONS: Therapeutic exercises, Aquatic therapy, Therapeutic activity, Neuro Muscular re-education, Gait training, Patient/Family education, Joint mobilization, Dry Needling, Electrical  stimulation, Spinal mobilization and/or manipulation, Moist heat, Taping, Vasopneumatic device, Ionotophoresis 30m/ml Dexamethasone, and Manual therapy   PLAN FOR NEXT SESSION: progressive shoulder ROM and strengthening as appropriate   KMathis Dad PT 05/27/2022, 5:41 PM

## 2022-06-03 ENCOUNTER — Encounter: Payer: Self-pay | Admitting: Physical Therapy

## 2022-06-03 ENCOUNTER — Ambulatory Visit: Payer: Medicaid Other | Admitting: Physical Therapy

## 2022-06-03 DIAGNOSIS — M25512 Pain in left shoulder: Secondary | ICD-10-CM | POA: Diagnosis not present

## 2022-06-03 DIAGNOSIS — R600 Localized edema: Secondary | ICD-10-CM

## 2022-06-03 DIAGNOSIS — M6281 Muscle weakness (generalized): Secondary | ICD-10-CM

## 2022-06-03 NOTE — Therapy (Signed)
OUTPATIENT PHYSICAL THERAPY TREATMENT NOTE   Patient Name: Steven Cross MRN: [id number] DOB:2000/06/18, 21 y.o., male Today's Date: 06/03/2022  PCP: Pcp, No  REFERRING PROVIDER: Hiram Gash, MD   END OF SESSION:   PT End of Session - 06/03/22 1658     Visit Number 10    Number of Visits 9    Date for PT Re-Evaluation 06/17/22    Authorization Type Approved 8 visits 04/23/22-06/22/22 - more visits requested 12/13    PT Start Time 0500    PT Stop Time 0540    PT Time Calculation (min) 40 min    Activity Tolerance Patient tolerated treatment well    Behavior During Therapy [facility] for tasks assessed/performed               Past Medical History:  Diagnosis Date   Anxiety    GERD (gastroesophageal reflux disease)    Past Surgical History:  Procedure Laterality Date   BANKART REPAIR Left 03/05/2022   Procedure: ARTHROSCOPIC BANKART REPAIR;  Surgeon: Hiram Gash, MD;  Location: [place];  Service: Orthopedics;  Laterality: Left;   SHOULDER ARTHROSCOPY Left 03/05/2022   Procedure: ARTHROSCOPY SHOULDER;  Surgeon: Hiram Gash, MD;  Location: [place];  Service: Orthopedics;  Laterality: Left;   Patient Active Problem List   Diagnosis Date Noted   GERD without esophagitis 12/13/2020    REFERRING DIAG: Labral repair and capsulorrhaphy on 9/21     THERAPY DIAG:  Left shoulder pain, unspecified chronicity  Muscle weakness  Localized edema  Rationale for Evaluation and Treatment Rehabilitation  PERTINENT HISTORY: L labral repair and capsulorrhaphy on 9/21    Onset date: 03/05/2022   PRECAUTIONS:  2 weeks 03/19/2022  4 weeks 04/02/2022  6 weeks 04/16/2022  8 weeks 04/30/2022  10 weeks 05/14/2022  12 weeks 05/28/2022        SUBJECTIVE:                                                                                                                                                                                      SUBJECTIVE  STATEMENT: Pt reports that the surgeon's office told him things were looking good.  He report that his shoulder is doing well.  PAIN:  Are you having pain? Yes Pain location: anterior shoulder NPRS scale:  current 0/10  average 1/10  Aggravating factors: movement Relieving factors: rest Pain description: aching Stage: Subacute 24 hour pattern: NA    OBJECTIVE:    UPPER EXTREMITY MMT:   See goals                UPPER EXTREMITY PROM:  PROM Right 04/22/2022 Left 04/22/2022 L 12/13  Shoulder flexion N 90 AROM  145  Shoulder abduction N nt   Shoulder internal rotation       Shoulder external rotation   15 degrees at neutral   Functional IR       Functional ER       Shoulder extension       Elbow extension       Elbow flexion         (Blank rows = not tested, N = WNL, * = concordant pain with testing)     PATIENT SURVEYS:  Quick Dash 41              TODAY'S TREATMENT:  Creating, reviewing, and completing below HEP      HOME EXERCISE PROGRAM: Access Code: [email or web address] URL: [email or web address] Date: 05/27/2022 Prepared by: Shearon Balo  Exercises - Standing shoulder flexion wall slides  - 1 x daily - 7 x weekly - 3 sets - 1 reps - Shoulder External Rotation with Anchored Resistance  - 1 x daily - 7 x weekly - 3 sets - 10 reps - Wall Push Up  - 1 x daily - 7 x weekly - 3 sets - 10 reps - Prone Shoulder Horizontal Abduction with Thumbs Up  - 1 x daily - 7 x weekly - 3 sets - 10 reps - Prone W Scapular Retraction  - 1 x daily - 7 x weekly - 3 sets - 10 reps - Prone Scapular Retraction Y  - 1 x daily - 7 x weekly - 3 sets - 10 reps - Shoulder Flexion Serratus Activation with Resistance  - 1 x daily - 7 x weekly - 3 sets - 10 reps - Forearm Walks on Wall with Resistance Band  - 1 x daily - 7 x weekly - 3 sets - 10 reps   ASTERISK SIGNS     Asterisk Signs Eval (04/22/2022)  11/21  11/22   12/6   12/13  Shoulder flexion AROM 90 degrees 100      110 127   145  Shoulder ER 15 @ neutral  25 @ neutral     Full ROM      PROM    130  135  140  142  155                                  TREATMENT 06/03/22:  Therapeutic Exercise: UBE L6.5 2.5/2.5 Wall push up - 3x10 ER ball toss - 3x10 Body blade - 3x to failure Wall walk - GTB - 4x5 Face pulls with OH press - RTB - 3x10 OH press at wall - 3x10 - 6# Prone T, Y, W - 3x10 ea Scaption at wall 4# - 3x10 KB bottom up 5# 90-90 carry 3 laps UE ranger 43'' - 20x flexion and scaption  Manual Therapy: PROM flexion and gentle ER  TREATMENT 05/12/2022: Therapeutic Exercise: UBE L5 2.5/2.5 Green pball flexion - supine - 3x10 Prone T, I, W - 3x10 ea Scaption at wall 2# - 3x10 UE ranger 34'' - 20x flexion and scaption ER with GTB  _0  3x15 IR with Black TB - 3x10 Shoulder abd circles on wall - 20x cw/cc   Manual Therapy: PROM flexion and gentle ER  TREATMENT 05/05/2022: Therapeutic Exercise: UBE L2 2.5/2.5 UE ranger 30'' - 20x flexion and scaption Pulley - 20x flexion with mirror AAROM forward  OH press to 90 degrees - with mirror - 3x20 Towel slide - 3x15 S/L ER -  3x15 - 3x15 S/L abduction to 90 - 3x15  Manual Therapy: PROM flexion and gentle ER     ASSESSMENT:   CLINICAL IMPRESSION: Steven Cross continues to progress as expected with improved ROM and strength.  Next visit will be his last visit with [place], as he is returning to school.  He will plan to seek PT or AT guidance there for the remaining portion of his rehab.  OBJECTIVE IMPAIRMENTS: Pain, shoulder ROM, shoulder strength   ACTIVITY LIMITATIONS: sport, housework, lifting, reaching   [place]: See medical history and pertinent history     REHAB POTENTIAL: Good   CLINICAL DECISION MAKING: Stable/uncomplicated   EVALUATION COMPLEXITY: Low     GOALS:     SHORT TERM GOALS: Target date: 05/20/2022   Steven Cross will be >75% HEP compliant to improve carryover between sessions and facilitate independent  management of condition   Evaluation (04/22/2022): ongoing Goal status: MET     LONG TERM GOALS: Target date: 06/17/2022   Steven Cross will show a >/= 21 pt improvement in his QUICK DASH score (MCID is 13.4 pts or 8%) as a proxy for functional improvement    Evaluation/Baseline (04/22/2022): 41 pts  12/13: QuickDASH Score: 29.5 / 100 = 29.5 % Goal status: Ongoing     2.  Merrick will demonstrate >150 degrees of active ROM in flexion to allow completion of activities involving reaching [place], not limited by pain   Evaluation/Baseline (04/22/2022): 90 degrees 12/13: 145 Goal status: Ongoing       3.  Zaccheus will achieve >= 60 degrees of shoulder ER at 90 degrees of abduction to allow proper shoulder mechanics during OH movement, not limited by pain    Evaluation/Baseline (04/22/2022): 15 degrees in neutral 12/13: MET Goal status: INITIAL     4.  Xabi will improve the following MMTs to >/= 4/5 to show improvement in strength:  shoulder flexion/abd/ER/IR    Evaluation/Baseline (04/22/2022): not tested d/t precautions 12/13: flexion: 4+/5 ER: 4/5 IR: 4/5 Goal status: INITIAL     5.  Chandan will report confidence in self management of condition at time of discharge with advanced HEP   Evaluation/Baseline (04/22/2022): unable to self manage Goal status: ongoing         PLAN: PT FREQUENCY: 1-2x/week   PT DURATION: 8 weeks (Ending 06/17/2022)   PLANNED INTERVENTIONS: Therapeutic exercises, Aquatic therapy, Therapeutic activity, Neuro Muscular re-education, Gait training, Patient/Family education, Joint mobilization, Dry Needling, Electrical stimulation, Spinal mobilization and/or manipulation, Moist heat, Taping, Vasopneumatic device, Ionotophoresis 77m/ml Dexamethasone, and Manual therapy   PLAN FOR NEXT SESSION: progressive shoulder ROM and strengthening as appropriate   KMathis Dad PT 06/03/2022, 5:41 PM

## 2022-06-10 ENCOUNTER — Ambulatory Visit: Payer: Medicaid Other

## 2022-06-10 DIAGNOSIS — M25512 Pain in left shoulder: Secondary | ICD-10-CM

## 2022-06-10 DIAGNOSIS — R600 Localized edema: Secondary | ICD-10-CM

## 2022-06-10 DIAGNOSIS — M6281 Muscle weakness (generalized): Secondary | ICD-10-CM | POA: Diagnosis not present

## 2022-06-10 NOTE — Therapy (Addendum)
PHYSICAL THERAPY DISCHARGE SUMMARY  Visits from Start of Care: 11  Current functional level related to goals / functional outcomes: See assessment/goals   Remaining deficits: See assessment/goals   Education / Equipment: HEP and D/C plans  Patient agrees to discharge. Patient goals were met. Patient is being discharged due to meeting the stated rehab goals.   Patient Name: Steven Cross MRN: [id number] DOB:12/18/2000, 21 y.o., male Today's Date: 06/12/2022  PCP: Pcp, No  REFERRING PROVIDER: Hiram Gash, MD   END OF SESSION:        Past Medical History:  Diagnosis Date   Anxiety    GERD (gastroesophageal reflux disease)    Past Surgical History:  Procedure Laterality Date   BANKART REPAIR Left 03/05/2022   Procedure: ARTHROSCOPIC BANKART REPAIR;  Surgeon: Hiram Gash, MD;  Location: [place];  Service: Orthopedics;  Laterality: Left;   SHOULDER ARTHROSCOPY Left 03/05/2022   Procedure: ARTHROSCOPY SHOULDER;  Surgeon: Hiram Gash, MD;  Location: [place];  Service: Orthopedics;  Laterality: Left;   Patient Active Problem List   Diagnosis Date Noted   GERD without esophagitis 12/13/2020    REFERRING DIAG: Labral repair and capsulorrhaphy on 9/21     THERAPY DIAG:  Left shoulder pain, unspecified chronicity  Muscle weakness  Localized edema  Rationale for Evaluation and Treatment Rehabilitation  PERTINENT HISTORY: L labral repair and capsulorrhaphy on 9/21    Onset date: 03/05/2022   PRECAUTIONS:  2 weeks 03/19/2022  4 weeks 04/02/2022  6 weeks 04/16/2022  8 weeks 04/30/2022  10 weeks 05/14/2022  12 weeks 05/28/2022        SUBJECTIVE:                                                                                                                                                                                      SUBJECTIVE STATEMENT: Patient reports he had some pain a few days ago, but used his iceman and that  the pain has improved.   PAIN:  Are you having pain? Yes Pain location: anterior shoulder NPRS scale:  current 0/10  average 1/10  Aggravating factors: movement Relieving factors: rest Pain description: aching Stage: Subacute 24 hour pattern: NA    OBJECTIVE:    UPPER EXTREMITY MMT:   See goals               UPPER EXTREMITY PROM:   PROM Right 04/22/2022 Left 04/22/2022 L 12/13 L 06/10/22  Shoulder flexion N 90 AROM  145   Shoulder abduction N nt    Shoulder internal rotation        Shoulder external rotation  15 degrees at neutral    Functional IR        Functional ER        Shoulder extension        Elbow extension        Elbow flexion          (Blank rows = not tested, N = WNL, * = concordant pain with testing)     PATIENT SURVEYS:  Quick Dash 41 06/10/22: 11.4/100 11.4%              TODAY'S TREATMENT:  Creating, reviewing, and completing below HEP      HOME EXERCISE PROGRAM: Access Code: [email or web address] URL: [email or web address] Date: 05/27/2022 Prepared by: Shearon Balo  Exercises - Standing shoulder flexion wall slides  - 1 x daily - 7 x weekly - 3 sets - 1 reps - Shoulder External Rotation with Anchored Resistance  - 1 x daily - 7 x weekly - 3 sets - 10 reps - Wall Push Up  - 1 x daily - 7 x weekly - 3 sets - 10 reps - Prone Shoulder Horizontal Abduction with Thumbs Up  - 1 x daily - 7 x weekly - 3 sets - 10 reps - Prone W Scapular Retraction  - 1 x daily - 7 x weekly - 3 sets - 10 reps - Prone Scapular Retraction Y  - 1 x daily - 7 x weekly - 3 sets - 10 reps - Shoulder Flexion Serratus Activation with Resistance  - 1 x daily - 7 x weekly - 3 sets - 10 reps - Forearm Walks on Wall with Resistance Band  - 1 x daily - 7 x weekly - 3 sets - 10 reps   ASTERISK SIGNS     Asterisk Signs Eval (04/22/2022)  11/21  11/22   12/6   12/13 12/27  Shoulder flexion AROM 90 degrees 100     110 127   145 155  Shoulder ER 15 @ neutral  25 @  neutral     Full ROM       PROM    130  135  140  142  155                                    TREATMENT 06/10/2022: Therapeutic Exercise: UBE L6.5 2.5/2.5 Wall push up - 3x10 Body blade - 2x to failure Wall walk - GTB - 4x5 Prone T, Y, W - 2x10 ea KB bottom up 5# 90-90 carry 3 laps Therapeutic Activity: Re-administration and discussion of QuickDASH, goniometric measurements, MMT, goals   TREATMENT 06/03/22:  Therapeutic Exercise: UBE L6.5 2.5/2.5 Wall push up - 3x10 ER ball toss - 3x10 Body blade - 3x to failure Wall walk - GTB - 4x5 Face pulls with OH press - RTB - 3x10 OH press at wall - 3x10 - 6# Prone T, Y, W - 3x10 ea Scaption at wall 4# - 3x10 KB bottom up 5# 90-90 carry 3 laps UE ranger 43'' - 20x flexion and scaption  Manual Therapy: PROM flexion and gentle ER  TREATMENT 05/12/2022: Therapeutic Exercise: UBE L5 2.5/2.5 Green pball flexion - supine - 3x10 Prone T, I, W - 3x10 ea Scaption at wall 2# - 3x10 UE ranger 50'' - 20x flexion and scaption ER with GTB  _0  3x15 IR with Black TB - 3x10 Shoulder abd circles on wall - 20x cw/cc   Manual  Therapy: PROM flexion and gentle ER     ASSESSMENT:   CLINICAL IMPRESSION: Patient reports that for two days recently, he had some pain in his shoulder and wasn't able to lift his arm, but that the pain has gone away and not lingered. Re-administered QuickDASH this session with patient exceeding LTG, showing improved functional independence. He reports that he feels fully confident to manage in the future and will reach out to PT or AT near his school if needed. He has met all goals and is appropriate for discharge from PT at this time.   OBJECTIVE IMPAIRMENTS: Pain, shoulder ROM, shoulder strength   ACTIVITY LIMITATIONS: sport, housework, lifting, reaching   [place]: See medical history and pertinent history     REHAB POTENTIAL: Good   CLINICAL DECISION MAKING: Stable/uncomplicated   EVALUATION  COMPLEXITY: Low     GOALS:     SHORT TERM GOALS: Target date: 05/20/2022   [place] will be >75% HEP compliant to improve carryover between sessions and facilitate independent management of condition   Evaluation (04/22/2022): ongoing Goal status: MET     LONG TERM GOALS: Target date: 06/17/2022   [name] will show a >/= 21 pt improvement in his QUICK DASH score (MCID is 13.4 pts or 8%) as a proxy for functional improvement    Evaluation/Baseline (04/22/2022): 41 pts  12/13: QuickDASH Score: 29.5 / 100 = 29.5 % 12/27: 11.4% Goal status: MET     2.  Abdulkarim will demonstrate >150 degrees of active ROM in flexion to allow completion of activities involving reaching [place], not limited by pain   Evaluation/Baseline (04/22/2022): 90 degrees 12/13: 145 12/27: 155 not limited by pain Goal status: MET       3.  Silvia will achieve >= 60 degrees of shoulder ER at 90 degrees of abduction to allow proper shoulder mechanics during OH movement, not limited by pain    Evaluation/Baseline (04/22/2022): 15 degrees in neutral 12/13: MET Goal status: MET     4.  Siddhanth will improve the following MMTs to >/= 4/5 to show improvement in strength:  shoulder flexion/abd/ER/IR    Evaluation/Baseline (04/22/2022): not tested d/t precautions 12/13: flexion: 4+/5 ER: 4/5 IR: 4/5 Goal status: MET     5.  Rayan will report confidence in self management of condition at time of discharge with advanced HEP   Evaluation/Baseline (04/22/2022): unable to self manage 12/27: Pt reports confidence  Goal status: MET         PLAN: PT FREQUENCY: 1-2x/week   PT DURATION: 8 weeks (Ending 06/17/2022)   PLANNED INTERVENTIONS: Therapeutic exercises, Aquatic therapy, Therapeutic activity, Neuro Muscular re-education, Gait training, Patient/Family education, Joint mobilization, Dry Needling, Electrical stimulation, Spinal mobilization and/or manipulation, Moist heat, Taping, Vasopneumatic device, Ionotophoresis 6m/ml  Dexamethasone, and Manual therapy   PLAN FOR NEXT SESSION: DC   KMathis Dad PT 06/12/2022, 8:55 AM

## 2022-06-15 DIAGNOSIS — Z419 Encounter for procedure for purposes other than remedying health state, unspecified: Secondary | ICD-10-CM | POA: Diagnosis not present

## 2022-06-26 DIAGNOSIS — S83002D Unspecified subluxation of left patella, subsequent encounter: Secondary | ICD-10-CM | POA: Diagnosis not present

## 2022-07-16 DIAGNOSIS — Z419 Encounter for procedure for purposes other than remedying health state, unspecified: Secondary | ICD-10-CM | POA: Diagnosis not present

## 2022-07-16 IMAGING — CT CT ABD-PELV W/ CM
4 series · 13 of 46 positions shown, 18 images · IV contrast (OMNIPAQUE)
Comparison: None.

CLINICAL DATA: Nausea, vomiting, upper abdominal pain for 3 weeks,
constipation

EXAM:
CT ABDOMEN AND PELVIS WITH CONTRAST
TECHNIQUE: Multidetector CT imaging of the abdomen and pelvis was performed
using the standard protocol following bolus administration of
intravenous contrast.
CONTRAST:  75mL OMNIPAQUE IOHEXOL 350 MG/ML SOLN, additional oral
enteric contrast

[Series 2: axial st · axial · 0.62mm/px · z∈[-448,-118]mm · 7 of 89 slices shown, 12 images]
[im 12/89  soft-tissue]
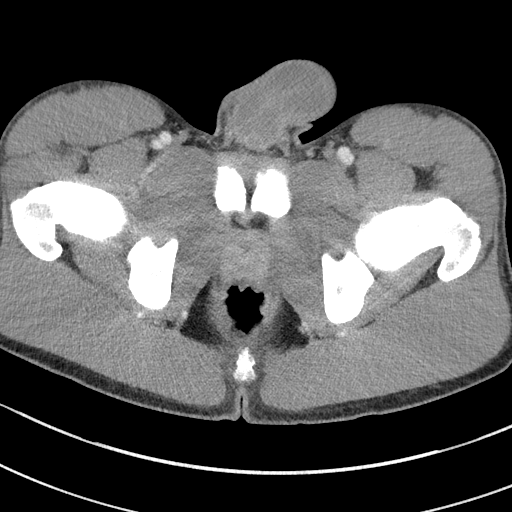
[im 12/89  bone]
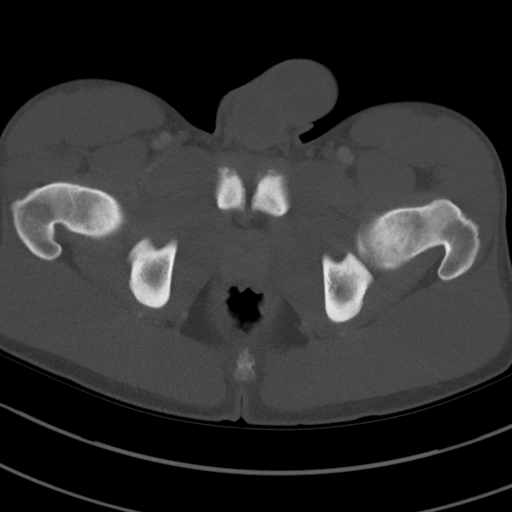
[im 23/89  soft-tissue]
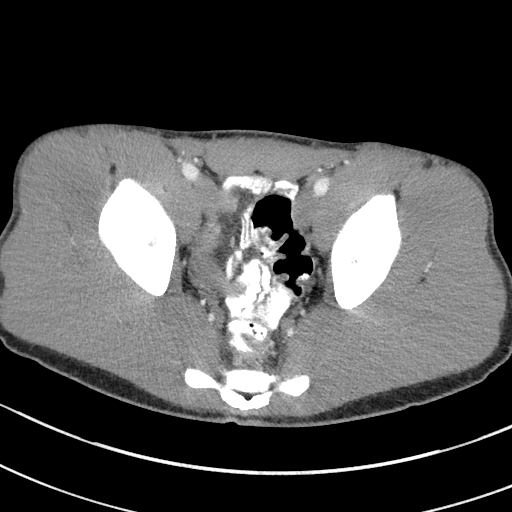
[im 34/89  soft-tissue]
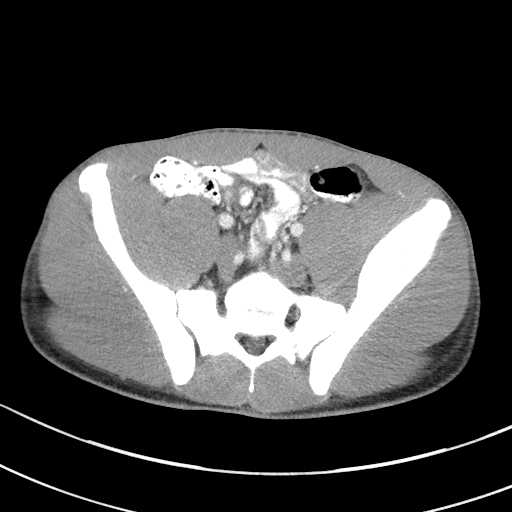
[im 45/89  soft-tissue]
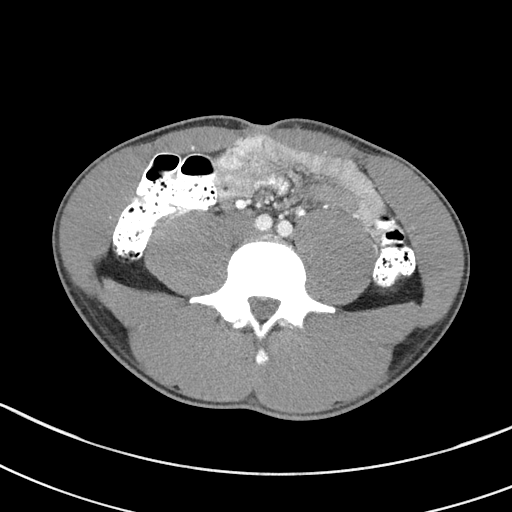
[im 45/89  lung]
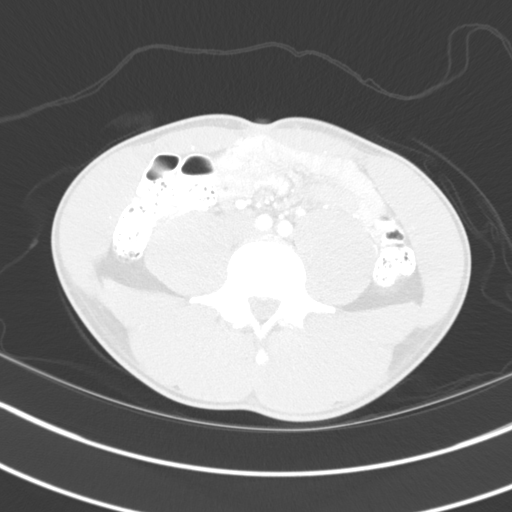
[im 56/89  soft-tissue]
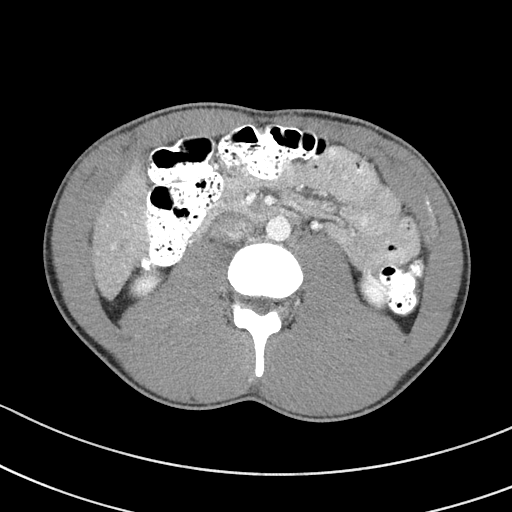
[im 56/89  lung]
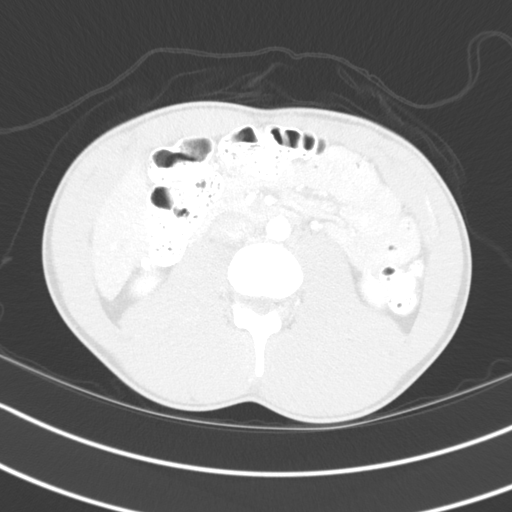
[im 67/89  soft-tissue]
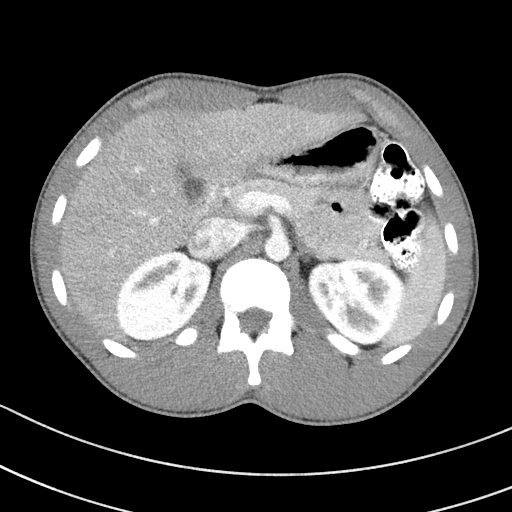
[im 67/89  lung]
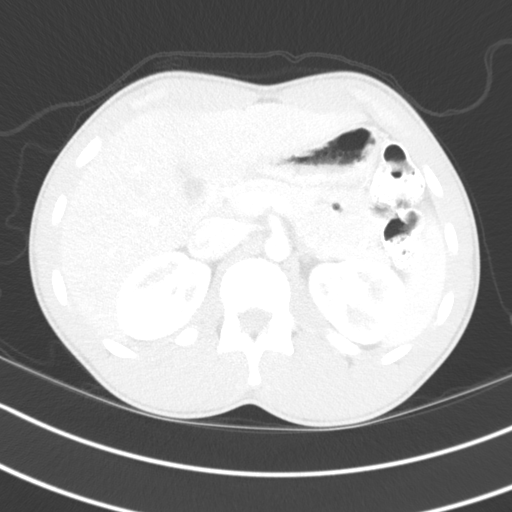
[im 78/89  soft-tissue]
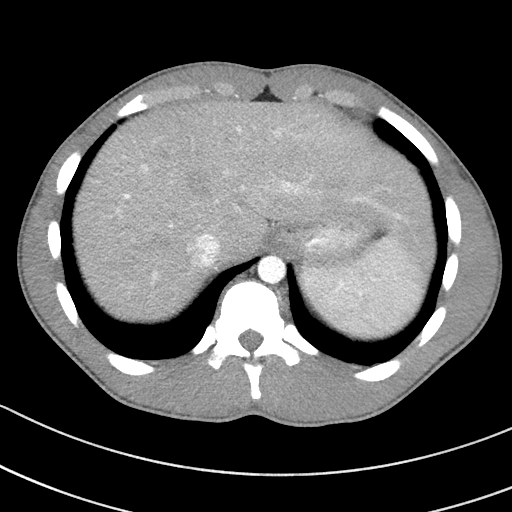
[im 78/89  lung]
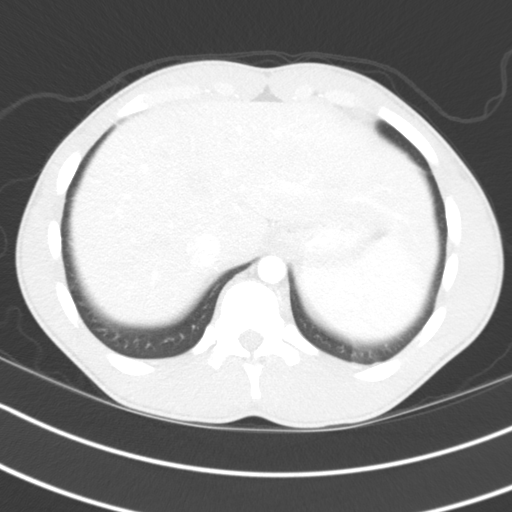

[Series 4: coronal st · coronal · 0.76mm/px · 3 of 75 slices shown]
[im 25/75  soft-tissue]
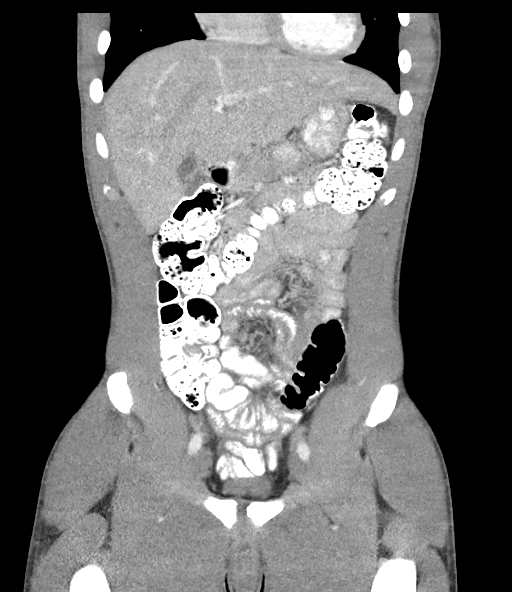
[im 33/75  soft-tissue]
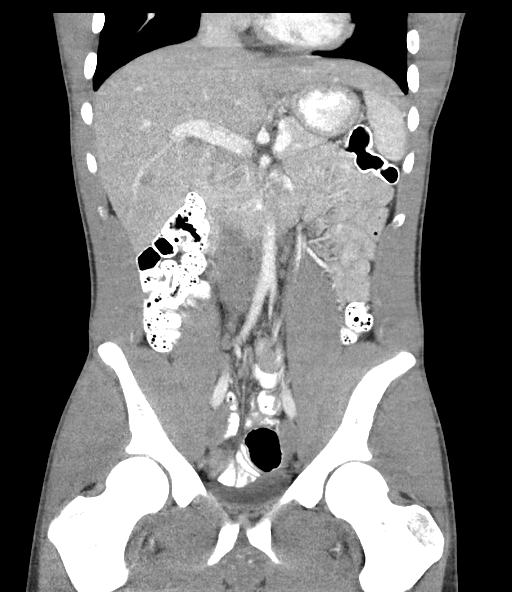
[im 42/75  soft-tissue]
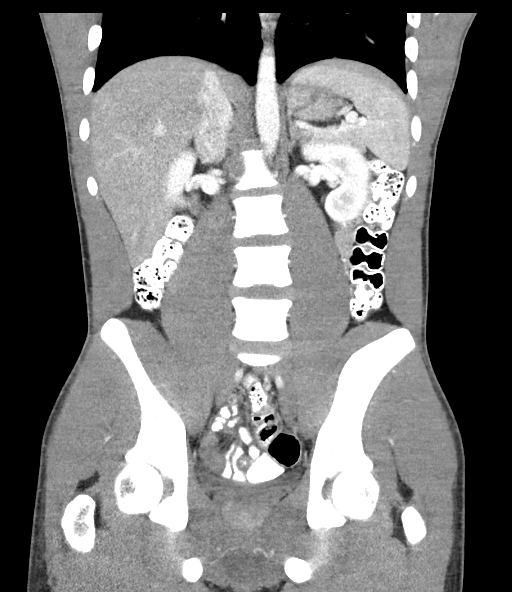

[Series 5: sagittal st · sagittal · 0.59mm/px · 1 of 114 slices shown]
[im 38/114  soft-tissue]
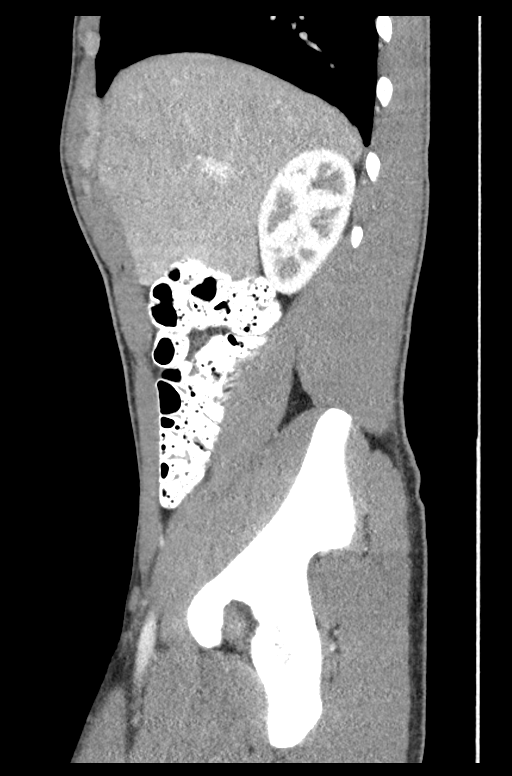

[Series 6: lung bases · axial · 0.62mm/px · z∈[-462,-422]mm · 2 of 221 slices shown]
[im 21/221  bone]
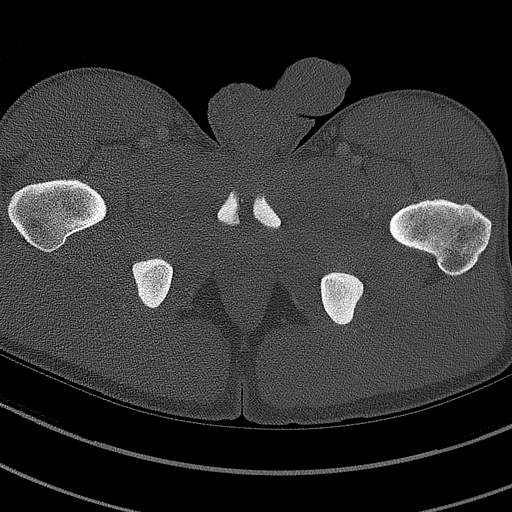
[im 41/221  bone]
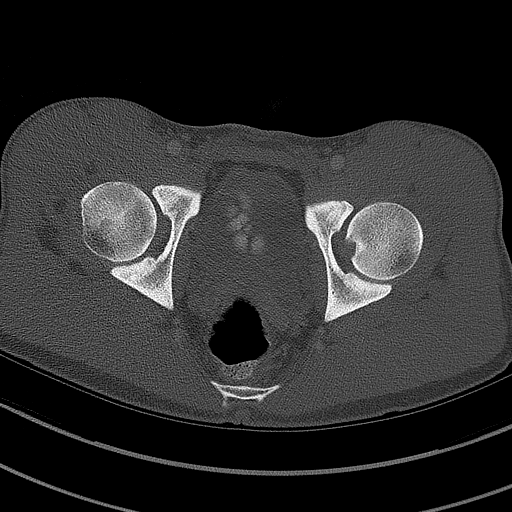

[13 of 46 positions shown; findings below may reference images not displayed]

FINDINGS: Lower chest: No acute abnormality.

Hepatobiliary: No solid liver abnormality is seen. No gallstones,
gallbladder wall thickening, or biliary dilatation.

Pancreas: Unremarkable. No pancreatic ductal dilatation or
surrounding inflammatory changes.

Spleen: Normal in size without significant abnormality.

Adrenals/Urinary Tract: Adrenal glands are unremarkable. Kidneys are
normal, without renal calculi, solid lesion, or hydronephrosis.
Bladder is unremarkable.

Stomach/Bowel: Stomach is within normal limits. Appendix appears
normal. No evidence of bowel wall thickening, distention, or
inflammatory changes.

Vascular/Lymphatic: No significant vascular findings are present. No
enlarged abdominal or pelvic lymph nodes.

Reproductive: No mass or other significant abnormality.

Other: No abdominal wall hernia or abnormality. No abdominopelvic
ascites.

Musculoskeletal: No acute or significant osseous findings.
IMPRESSION: 1. No CT findings of the abdomen or pelvis to explain nausea,
vomiting, or abdominal pain. Normal appendix.

2. No large burden of stool in the colon per report of constipation.

## 2022-08-14 DIAGNOSIS — Z419 Encounter for procedure for purposes other than remedying health state, unspecified: Secondary | ICD-10-CM | POA: Diagnosis not present

## 2022-08-25 ENCOUNTER — Other Ambulatory Visit: Payer: Self-pay | Admitting: Physician Assistant

## 2022-09-14 DIAGNOSIS — Z419 Encounter for procedure for purposes other than remedying health state, unspecified: Secondary | ICD-10-CM | POA: Diagnosis not present

## 2022-10-14 DIAGNOSIS — Z419 Encounter for procedure for purposes other than remedying health state, unspecified: Secondary | ICD-10-CM | POA: Diagnosis not present

## 2022-11-14 DIAGNOSIS — Z419 Encounter for procedure for purposes other than remedying health state, unspecified: Secondary | ICD-10-CM | POA: Diagnosis not present

## 2022-12-14 DIAGNOSIS — Z419 Encounter for procedure for purposes other than remedying health state, unspecified: Secondary | ICD-10-CM | POA: Diagnosis not present

## 2022-12-28 NOTE — Progress Notes (Deleted)
12/28/2022 Steven Cross [id number] 2000-07-19  Referring provider: No ref. provider found Primary GI doctor: (Dr. Christella Hartigan)  ASSESSMENT AND PLAN:   There are no diagnoses linked to this encounter.   Patient Care Team: Pcp, No as PCP - General  HISTORY OF PRESENT ILLNESS: 22 y.o. male with a past medical history of anxiety, GERD, constipation and others listed below presents for med refill.   1.  Minor rectal bleeding led to colonoscopy July 2022.  Internal hemorrhoids were found.  The examination was otherwise normal.  I recommended that he try fiber supplements on a daily basis. 2.  Epigastric abdominal pain led to upper endoscopy July 2022.  This showed minimal nonspecific distal gastritis.  Biopsies were negative for H. pylori. Improved on PPI AM/pepcid PM 3. Post prandial AB pain intermittent nausea and vomiting 04/2021 CT ABAP with IV normal except large stool burden.   Patient {Actions; denies-reports:120008} family history of colon cancer or other gastrointestinal malignancies.   He {Actions; denies-reports:120008} blood thinner use.  He {Actions; denies-reports:120008} NSAID use.  He {Actions; denies-reports:120008} ETOH use.   He {Actions; denies-reports:120008} tobacco use.  He {Actions; denies-reports:120008} drug use.    He  reports that he has never smoked. He has never used smokeless tobacco. He reports current alcohol use. He reports that he does not use drugs.  RELEVANT LABS AND IMAGING: CBC    Component Value Date/Time   WBC 6.3 05/01/2021 1514   RBC 5.35 05/01/2021 1514   HGB 15.2 05/01/2021 1514   HCT 45.6 05/01/2021 1514   PLT 251.0 05/01/2021 1514   MCV 85.4 05/01/2021 1514   MCH 29.7 08/23/2019 1818   MCHC 33.4 05/01/2021 1514   RDW 12.9 05/01/2021 1514   LYMPHSABS 0.9 05/01/2021 1514   MONOABS 1.0 05/01/2021 1514   EOSABS 0.0 05/01/2021 1514   BASOSABS 0.0 05/01/2021 1514   No results for input(s): "HGB" in the last 8760  hours.  CMP     Component Value Date/Time   NA 139 05/01/2021 1514   K 3.8 05/01/2021 1514   CL 104 05/01/2021 1514   CO2 30 05/01/2021 1514   GLUCOSE 109 (H) 05/01/2021 1514   BUN 8 05/01/2021 1514   CREATININE 1.33 05/01/2021 1514   CALCIUM 9.6 05/01/2021 1514   PROT 7.5 05/01/2021 1514   ALBUMIN 4.5 05/01/2021 1514   AST 14 05/01/2021 1514   ALT 12 05/01/2021 1514   ALKPHOS 70 05/01/2021 1514   BILITOT 0.6 05/01/2021 1514   GFRNONAA NOT CALCULATED 08/23/2019 1818   GFRAA NOT CALCULATED 08/23/2019 1818      Latest Ref Rng & Units 05/01/2021    3:14 PM 12/20/2020   10:19 AM 08/23/2019    6:18 PM  Hepatic Function  Total Protein 6.0 - 8.3 g/dL 7.5  7.9  7.6   Albumin 3.5 - 5.2 g/dL 4.5  4.9  4.4   AST 0 - 37 U/L 14  16  28    ALT 0 - 53 U/L 12  16  22    Alk Phosphatase 52 - 171 U/L 70  77  98   Total Bilirubin 0.2 - 1.2 mg/dL 0.6  1.0  0.7       Current Medications:      Current Outpatient Medications (Analgesics):    Acetaminophen Extra Strength 500 MG CAPS, Take 2 tablets (1,000 mg) by mouth every 8 (eight) hours as needed for pain.   diclofenac (VOLTAREN) 75 MG EC tablet, Take 1 tablet by mouth  twice a day as needed for pain   oxyCODONE (OXY IR/ROXICODONE) 5 MG immediate release tablet, Take 1-2 tablets (5-10 mg total) by mouth every 6 (six) hours as needed for pain, max 6 tablets per day   Current Outpatient Medications (Other):    calcium carbonate (TUMS - DOSED IN MG ELEMENTAL CALCIUM) 500 MG chewable tablet, Chew 1 tablet by mouth as needed for indigestion or heartburn.   famotidine (PEPCID) 40 MG tablet, TAKE 1 TABLET(40 MG) BY MOUTH DAILY   omeprazole (PRILOSEC) 40 MG capsule, Take 1 capsule every other day for 1 week, then 1 capsule every third day for 1 week, then discontinue.   ondansetron (ZOFRAN) 4 MG tablet, Take 1 tablet (4 mg total) by mouth every 6 (six) hours as needed for nausea/vomiting  Medical History:  Past Medical History:  Diagnosis Date    Anxiety    GERD (gastroesophageal reflux disease)    Allergies: No Known Allergies   Surgical History:  He  has a past surgical history that includes Shoulder arthroscopy (Left, 03/05/2022) and Bankart repair (Left, 03/05/2022). Family History:  His family history includes Asthma in his mother.  REVIEW OF SYSTEMS  : All other systems reviewed and negative except where noted in the History of Present Illness.  PHYSICAL EXAM: There were no vitals taken for this visit. General Appearance: Well nourished, in no apparent distress. Head:   Normocephalic and atraumatic. Eyes:  sclerae anicteric,conjunctive pink  Respiratory: Respiratory effort normal, BS equal bilaterally without rales, rhonchi, wheezing. Cardio: RRR with no MRGs. Peripheral pulses intact.  Abdomen: Soft,  {BlankSingle:19197::"Flat","Obese","Non-distended"} ,active bowel sounds. {actendernessAB:27319} tenderness {anatomy; site abdomen:5010}. {BlankMultiple:19196::"Without guarding","With guarding","Without rebound","With rebound"}. No masses. Rectal: {acrectalexam:27461} Musculoskeletal: Full ROM, {PSY - GAIT AND STATION:22860} gait. {With/Without:304960234} edema. Skin:  Dry and intact without significant lesions or rashes Neuro: Alert and  oriented x4;  No focal deficits. Psych:  Cooperative. Normal mood and affect.    Doree Albee, PA-C 4:56 PM

## 2023-01-01 ENCOUNTER — Ambulatory Visit (INDEPENDENT_AMBULATORY_CARE_PROVIDER_SITE_OTHER): Payer: Medicaid Other | Admitting: Gastroenterology

## 2023-01-01 ENCOUNTER — Encounter: Payer: Self-pay | Admitting: Gastroenterology

## 2023-01-01 VITALS — BP 100/70 | HR 57 | Ht 70.0 in | Wt 164.0 lb

## 2023-01-01 DIAGNOSIS — K6289 Other specified diseases of anus and rectum: Secondary | ICD-10-CM

## 2023-01-01 DIAGNOSIS — K644 Residual hemorrhoidal skin tags: Secondary | ICD-10-CM

## 2023-01-01 MED ORDER — HYDROCORTISONE (PERIANAL) 2.5 % EX CREA: 1.0000 | TOPICAL_CREAM | Freq: Two times a day (BID) | CUTANEOUS | 1 refills | Status: DC

## 2023-01-01 NOTE — Progress Notes (Signed)
____________________________________________________________  Attending physician addendum:  Thank you for sending this case to me. I have reviewed the entire note and agree with the plan.  Possible he has a small anal fissure.  Recticare ointment worth a try as well  Amada Jupiter, MD  ____________________________________________________________

## 2023-01-01 NOTE — Patient Instructions (Addendum)
We have sent the following medications to your pharmacy for you to pick up at your convenience: Hydrocortisone cream  How to Take a Sitz Bath A sitz bath is a warm water bath that may be used to care for your rectum, genital area, or the area between your rectum and genitals (perineum). In a sitz bath, the water only comes up to your hips and covers your buttocks. A sitz bath may be done in a bathtub or with a portable sitz bath that fits over the toilet. Your health care provider may recommend a sitz bath to help: Relieve pain and discomfort after delivering a baby. Relieve pain and itching from hemorrhoids or anal fissures. Relieve pain after certain surgeries. Relax muscles that are sore or tight. How to take a sitz bath Take 2-4 sitz baths a day, or as many as told by your health care provider. Bathtub sitz bath To take a sitz bath in a bathtub: Partially fill a bathtub with warm water. The water should be deep enough to cover your hips and buttocks when you are sitting in the bathtub. Follow your health care provider's instructions if you are told to put medicine in the water. Sit in the water. Open the bathtub drain a little, and leave it open during your bath. Turn on the warm water again, enough to replace the water that is draining out. Keep the water running throughout your bath. This helps keep the water at the right level and temperature. Soak in the water for 15-20 minutes, or as long as told by your health care provider. When you are done, be careful when you stand up. You may feel dizzy. After the sitz bath, pat yourself dry. Do not rub your skin to dry it.  Over-the-toilet sitz bath To take a sitz bath with an over-the-toilet basin: Follow the manufacturer's instructions. Fill the basin with warm water. Follow your health care provider's instructions if you were told to put medicine in the water. Sit on the seat. Make sure the water covers your buttocks and perineum. Soak  in the water for 15-20 minutes, or as long as told by your health care provider. After the sitz bath, pat yourself dry. Do not rub your skin to dry it. Clean and dry the basin between uses. Discard the basin if it cracks, or according to the manufacturer's instructions.  Contact a health care provider if: Your pain or itching gets worse. Stop doing sitz baths if your symptoms get worse. You have new symptoms. Stop doing sitz baths until you talk with your health care provider. Summary A sitz bath is a warm water bath in which the water only comes up to your hips and covers your buttocks. Your health care provider may recommend a sitz bath to help relieve pain and discomfort after delivering a baby, relieve pain and itching from hemorrhoids or anal fissures, relieve pain after certain surgeries, or help to relax muscles that are sore or tight. Take 2-4 sitz baths a day, or as many as told by your health care provider. Soak in the water for 15-20 minutes. Stop doing sitz baths if your symptoms get worse. This information is not intended to replace advice given to you by your health care provider. Make sure you discuss any questions you have with your health care provider. Document Revised: 09/02/2021 Document Reviewed: 09/02/2021 Elsevier Patient Education  2024 [employer].  I appreciate the  opportunity to care for you  Thank You   [facility]

## 2023-01-01 NOTE — Progress Notes (Signed)
Chief Complaint: Rectal pain Primary GI MD: (Dr. Christella Hartigan)  HISTORY OF PRESENT ILLNESS: 22 y.o. male with a past medical history of anxiety, GERD, constipation and others listed below presents for med refill.   Last seen in 2022 by Dr. Christella Hartigan   1.  Minor rectal bleeding led to colonoscopy July 2022.  Internal hemorrhoids were found.  The examination was otherwise normal.  recommended that he try fiber supplements on a daily basis. 2.  Epigastric abdominal pain led to upper endoscopy July 2022.  This showed minimal nonspecific distal gastritis.  Biopsies were negative for H. pylori. Improved on PPI AM/pepcid PM 3. Post prandial AB pain intermittent nausea and vomiting 04/2021 CT ABAP with IV normal except large stool burden.    He  reports that he has never smoked. He has never used smokeless tobacco. He reports current alcohol use. He reports that he does not use drugs.   He is here today due to rectal pain. Reports when he has a bowel movement he has pain afterwards and when he goes to sit down he will have to adjust the way he sits due to the pain. He will have this pain for 2-3 days and then it will resolve on its own. Reports occasional straining with bowel movements.  Past Medical History:  Diagnosis Date   Anxiety    GERD (gastroesophageal reflux disease)     Past Surgical History:  Procedure Laterality Date   BANKART REPAIR Left 03/05/2022   Procedure: ARTHROSCOPIC BANKART REPAIR;  Surgeon: Bjorn Pippin, MD;  Location: [facility];  Service: Orthopedics;  Laterality: Left;   SHOULDER ARTHROSCOPY Left 03/05/2022   Procedure: ARTHROSCOPY SHOULDER;  Surgeon: Bjorn Pippin, MD;  Location:  SURGERY CENTER;  Service: Orthopedics;  Laterality: Left;    Current Outpatient Medications  Medication Sig Dispense Refill   Acetaminophen Extra Strength 500 MG CAPS Take 2 tablets (1,000 mg) by mouth every 8 (eight) hours as needed for pain. 84 capsule 0   calcium  carbonate (TUMS - DOSED IN MG ELEMENTAL CALCIUM) 500 MG chewable tablet Chew 1 tablet by mouth as needed for indigestion or heartburn.     diclofenac (VOLTAREN) 75 MG EC tablet Take 1 tablet by mouth twice a day as needed for pain 60 tablet 0   famotidine (PEPCID) 40 MG tablet TAKE 1 TABLET(40 MG) BY MOUTH DAILY 30 tablet 0   omeprazole (PRILOSEC) 40 MG capsule Take 1 capsule every other day for 1 week, then 1 capsule every third day for 1 week, then discontinue. 30 capsule 4   ondansetron (ZOFRAN) 4 MG tablet Take 1 tablet (4 mg total) by mouth every 6 (six) hours as needed for nausea/vomiting 10 tablet 0   oxyCODONE (OXY IR/ROXICODONE) 5 MG immediate release tablet Take 1-2 tablets (5-10 mg total) by mouth every 6 (six) hours as needed for pain, max 6 tablets per day 24 tablet 0   No current facility-administered medications for this visit.    Allergies as of 01/01/2023   (No Known Allergies)    Family History  Problem Relation Age of Onset   Asthma Mother    Colon cancer Neg Hx    Esophageal cancer Neg Hx    Stomach cancer Neg Hx     Social History   Socioeconomic History   Marital status: Single    Spouse name: Not on file   Number of children: Not on file   Years of education: Not on file  Highest education level: Not on file  Occupational History   Not on file  Tobacco Use   Smoking status: Never   Smokeless tobacco: Never  Vaping Use   Vaping status: Never Used  Substance and Sexual Activity   Alcohol use: Yes    Comment: occ   Drug use: No   Sexual activity: Yes  Other Topics Concern   Not on file  Social History Narrative   Not on file   Social Determinants of Health   Financial Resource Strain: Not on file  Food Insecurity: Not on file  Transportation Needs: Not on file  Physical Activity: Not on file  Stress: Not on file  Social Connections: Not on file  Intimate Partner Violence: Not on file    Review of Systems:    Constitutional: No weight  loss, fever, chills, weakness or fatigue HEENT: Eyes: No change in vision               Ears, Nose, Throat:  No change in hearing or congestion Skin: No rash or itching Cardiovascular: No chest pain, chest pressure or palpitations   Respiratory: No SOB or cough Gastrointestinal: See HPI and otherwise negative Genitourinary: No dysuria or change in urinary frequency Neurological: No headache, dizziness or syncope Musculoskeletal: No new muscle or joint pain Hematologic: No bleeding or bruising Psychiatric: No history of depression or anxiety    Physical Exam:  Vital signs: There were no vitals taken for this visit.  Constitutional: NAD, Well developed, Well nourished, alert and cooperative Head:  Normocephalic and atraumatic. Eyes:   PEERL, EOMI. No icterus. Conjunctiva pink. Respiratory: Respirations even and unlabored. Lungs clear to auscultation bilaterally.   No wheezes, crackles, or rhonchi.  Cardiovascular:  Regular rate and rhythm. No peripheral edema, cyanosis or pallor.  Gastrointestinal:  Soft, nondistended, nontender. No rebound or guarding. Normal bowel sounds. No appreciable masses or hepatomegaly. 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 No anal fissures. Small external hemorrhoid, nonthrombosed, tender. Internal hemorrhoid noted. No stool in vault. Msk:  Symmetrical without gross deformities. Without edema, no deformity or joint abnormality.  Neurologic:  Alert and  oriented x4;  grossly normal neurologically.  Skin:   Dry and intact without significant lesions or rashes. Psychiatric: Oriented to person, place and time. Demonstrates good judgement and reason without abnormal affect or behaviors.   RELEVANT LABS AND IMAGING: CBC    Component Value Date/Time   WBC 6.3 05/01/2021 1514   RBC 5.35 05/01/2021 1514   HGB 15.2 05/01/2021 1514   HCT 45.6 05/01/2021 1514   PLT 251.0 05/01/2021 1514   MCV 85.4 05/01/2021 1514   MCH 29.7 08/23/2019 1818   MCHC 33.4 05/01/2021 1514    RDW 12.9 05/01/2021 1514   LYMPHSABS 0.9 05/01/2021 1514   MONOABS 1.0 05/01/2021 1514   EOSABS 0.0 05/01/2021 1514   BASOSABS 0.0 05/01/2021 1514    CMP     Component Value Date/Time   NA 139 05/01/2021 1514   K 3.8 05/01/2021 1514   CL 104 05/01/2021 1514   CO2 30 05/01/2021 1514   GLUCOSE 109 (H) 05/01/2021 1514   BUN 8 05/01/2021 1514   CREATININE 1.33 05/01/2021 1514   CALCIUM 9.6 05/01/2021 1514   PROT 7.5 05/01/2021 1514   ALBUMIN 4.5 05/01/2021 1514   AST 14 05/01/2021 1514   ALT 12 05/01/2021 1514   ALKPHOS 70 05/01/2021 1514   BILITOT 0.6 05/01/2021 1514   GFRNONAA NOT CALCULATED 08/23/2019 1818   GFRAA NOT CALCULATED 08/23/2019  1818     Assessment/Plan:   Rectal pain External hemorrhoids Rectal pain secondary to hemorrhoids. External hemorrhoid was tender and nonthrombosed. Presence of internal hemorrhoids on PE and on colonoscopy. - Sitz baths - Hydrocortisone cream 2.5% twice daily for 14 days - discussed use of squatty potty, increase fiber, and increase water - if no improvement with hydrocortisone cream, can send in suppositories. - f/u prn   Boone Master, PA-C [facility] 01/01/2023, 8:37 AM  Cc: No ref. provider found

## 2023-01-14 DIAGNOSIS — Z419 Encounter for procedure for purposes other than remedying health state, unspecified: Secondary | ICD-10-CM | POA: Diagnosis not present

## 2023-02-14 DIAGNOSIS — Z419 Encounter for procedure for purposes other than remedying health state, unspecified: Secondary | ICD-10-CM | POA: Diagnosis not present

## 2023-03-16 DIAGNOSIS — Z419 Encounter for procedure for purposes other than remedying health state, unspecified: Secondary | ICD-10-CM | POA: Diagnosis not present

## 2023-04-16 DIAGNOSIS — Z419 Encounter for procedure for purposes other than remedying health state, unspecified: Secondary | ICD-10-CM | POA: Diagnosis not present

## 2023-05-16 DIAGNOSIS — Z419 Encounter for procedure for purposes other than remedying health state, unspecified: Secondary | ICD-10-CM | POA: Diagnosis not present

## 2023-06-16 DIAGNOSIS — Z419 Encounter for procedure for purposes other than remedying health state, unspecified: Secondary | ICD-10-CM | POA: Diagnosis not present

## 2023-07-17 DIAGNOSIS — Z419 Encounter for procedure for purposes other than remedying health state, unspecified: Secondary | ICD-10-CM | POA: Diagnosis not present

## 2023-08-14 DIAGNOSIS — Z419 Encounter for procedure for purposes other than remedying health state, unspecified: Secondary | ICD-10-CM | POA: Diagnosis not present

## 2023-09-25 DIAGNOSIS — Z419 Encounter for procedure for purposes other than remedying health state, unspecified: Secondary | ICD-10-CM | POA: Diagnosis not present

## 2023-09-30 DIAGNOSIS — K6289 Other specified diseases of anus and rectum: Secondary | ICD-10-CM | POA: Diagnosis not present

## 2023-09-30 DIAGNOSIS — K649 Unspecified hemorrhoids: Secondary | ICD-10-CM | POA: Diagnosis not present

## 2023-10-25 DIAGNOSIS — Z419 Encounter for procedure for purposes other than remedying health state, unspecified: Secondary | ICD-10-CM | POA: Diagnosis not present

## 2023-11-13 ENCOUNTER — Emergency Department (HOSPITAL_BASED_OUTPATIENT_CLINIC_OR_DEPARTMENT_OTHER): Admission: EM | Admit: 2023-11-13 | Discharge: 2023-11-13 | Disposition: A | Discharge status: Home / Self Care

## 2023-11-13 ENCOUNTER — Other Ambulatory Visit: Payer: Self-pay

## 2023-11-13 DIAGNOSIS — K644 Residual hemorrhoidal skin tags: Secondary | ICD-10-CM | POA: Insufficient documentation

## 2023-11-13 MED ORDER — LIDOCAINE 5 % EX OINT: 1.0000 | TOPICAL_OINTMENT | CUTANEOUS | 0 refills | Status: AC | PRN

## 2023-11-13 MED ORDER — HYDROCORTISONE (PERIANAL) 2.5 % EX CREA: 1.0000 | TOPICAL_CREAM | Freq: Two times a day (BID) | CUTANEOUS | 1 refills | Status: AC

## 2023-11-13 NOTE — ED Triage Notes (Signed)
 Patient presents to ED with c/o hemorrhoids that have been present for a year. Denies worsening symptoms today, requesting medication.

## 2023-11-13 NOTE — ED Provider Notes (Signed)
 [place] [facility] AT [facility] Provider Note   CSN: [id number] Arrival date & time: 11/13/23  4098     History  Chief Complaint  Patient presents with   Hemorrhoids    Steven Cross is a 23 y.o. male.  Patient to ED with painful anal swelling reporting history of hemorrhoids. No bleeding. He states sometimes having to strain for a bowel movement. No fever, abdominal pain, vomiting.   The history is provided by the patient. No language interpreter was used.       Home Medications Prior to Admission medications   Medication Sig Start Date End Date Taking? Authorizing Provider  lidocaine  (XYLOCAINE ) 5 % ointment Apply 1 Application topically as needed. 11/13/23  Yes Mandy Second, PA-C  Acetaminophen  Extra Strength 500 MG CAPS Take 2 tablets (1,000 mg) by mouth every 8 (eight) hours as needed for pain. 03/05/22     calcium carbonate (TUMS - DOSED IN MG ELEMENTAL CALCIUM) 500 MG chewable tablet Chew 1 tablet by mouth as needed for indigestion or heartburn.    [provider]  diclofenac  (VOLTAREN ) 75 MG EC tablet Take 1 tablet by mouth twice a day as needed for pain 03/05/22     famotidine  (PEPCID ) 40 MG tablet TAKE 1 TABLET(40 MG) BY MOUTH DAILY 08/25/22   Esterwood, Amy S, PA-C  hydrocortisone  (ANUSOL -HC) 2.5 % rectal cream Place 1 Application rectally 2 (two) times daily. Use pea sized amount perianal twice daily for 14 days 11/13/23   Mandy Second, PA-C      Allergies    Patient has no known allergies.    Review of Systems   Review of Systems  Physical Exam Updated Vital Signs BP 128/88 (BP Location: Right Arm)   Pulse 61   Temp 98.6 F (37 C) (Oral)   Resp 18   Ht 5\' 10"  (1.778 m)   Wt 77.1 kg   SpO2 98%   BMI 24.39 kg/m  Physical Exam Constitutional:      Appearance: He is well-developed.  Pulmonary:     Effort: Pulmonary effort is normal.  Genitourinary:    Comments: 2 large external hemorrhoids visualized without  thrombosis. No bleed. Tender.  Musculoskeletal:        General: Normal range of motion.     Cervical back: Normal range of motion.  Skin:    General: Skin is warm and dry.  Neurological:     Mental Status: He is alert and oriented to person, place, and time.     ED Results / Procedures / Treatments   Labs (all labs ordered are listed, but only abnormal results are displayed) Labs Reviewed - No data to display  EKG None  Radiology No results found.  Procedures Procedures    Medications Ordered in ED Medications - No data to display  ED Course/ Medical Decision Making/ A&P Clinical Course as of 11/13/23 1023  Sat Nov 13, 2023  1022 Patient with recurrent hemorrhoids. Today he has external ones, has had internal hemorrhoids visualized on colonoscopy in the past. Followed by GI and has pending appointment in 2 days. Will provide hydrocortisone  topical, and topical lidocaine  ointment.  [SU]    Clinical Course User Index [SU] Mandy Second, PA-C                                 Medical Decision Making          Final Clinical Impression(s) /  ED Diagnoses Final diagnoses:  External hemorrhoids    Rx / DC Orders ED Discharge Orders          Ordered    hydrocortisone  (ANUSOL -HC) 2.5 % rectal cream  2 times daily        11/13/23 1019    lidocaine  (XYLOCAINE ) 5 % ointment  As needed        11/13/23 1019              Mandy Second, PA-C 11/13/23 1023    Carin Charleston, MD 11/14/23 [phone]

## 2023-11-13 NOTE — Discharge Instructions (Signed)
 Keep your scheduled appointment with Gastroenterology for day after tomorrow. Use the topical medications as prescribed to shrink the size of the hemorrhoids and for pain. It is also recommended that you use a stool softener to avoid worsening the condition, such as Colace, found over-the-counter.

## 2023-11-15 ENCOUNTER — Encounter: Payer: Self-pay | Admitting: Physician Assistant

## 2023-11-15 ENCOUNTER — Ambulatory Visit (INDEPENDENT_AMBULATORY_CARE_PROVIDER_SITE_OTHER): Admitting: Physician Assistant

## 2023-11-15 VITALS — BP 102/60 | HR 65 | Ht 70.0 in | Wt 168.0 lb

## 2023-11-15 DIAGNOSIS — K5900 Constipation, unspecified: Secondary | ICD-10-CM | POA: Diagnosis not present

## 2023-11-15 DIAGNOSIS — K640 First degree hemorrhoids: Secondary | ICD-10-CM | POA: Diagnosis not present

## 2023-11-15 DIAGNOSIS — K6289 Other specified diseases of anus and rectum: Secondary | ICD-10-CM

## 2023-11-15 MED ORDER — HYDROCORTISONE ACETATE 25 MG RE SUPP: 25.0000 mg | Freq: Two times a day (BID) | RECTAL | 1 refills | Status: DC

## 2023-11-15 NOTE — Patient Instructions (Addendum)
 We have sent the following medications to your pharmacy for you to pick up at your convenience: Hydrocortisone  suppository twice daily for 7 days.   _______________________________________________________  If your blood pressure at your visit was 140/90 or greater, please contact your primary care physician to follow up on this.  _______________________________________________________  If you are age 22 or older, your body mass index should be between 23-30. Your Body mass index is 24.11 kg/m. If this is out of the aforementioned range listed, please consider follow up with your Primary Care Provider.  If you are age 48 or younger, your body mass index should be between 19-25. Your Body mass index is 24.11 kg/m. If this is out of the aformentioned range listed, please consider follow up with your Primary Care Provider.   ________________________________________________________  The [facility] providers would like to encourage you to use MYCHART to communicate with providers for non-urgent requests or questions.  Due to long hold times on the telephone, sending your provider a message by [facility] may be a faster and more efficient way to get a response.  Please allow 48 business hours for a response.  Please remember that this is for non-urgent requests.  _______________________________________________________

## 2023-11-15 NOTE — Progress Notes (Signed)
 Chief Complaint: Hemorrhoids  HPI:    Steven Cross is a  23 y./o AA male with a past medical history as listed below including anxiety and reflux, previously known to Dr. Howard Macho, who presents to clinic today with a complaint of hemorrhoids.      12/25/2020 colonoscopy done for rectal bleeding with internal hemorrhoids.  Otherwise normal.    12/25/2020 EGD with minimal inflammation characterized by erythema in the gastric antrum.  Diagnosed with gastritis.  Biopsies normal.    11/13/2023 patient seen in the ER for painful anal swelling.  That time found to have 2 large external hemorrhoids which were nonthrombosed.  He was given Hydrocortisone  and Lidocaine .    Today, patient presents to clinic and explains that he has always had off-and-on issues with hemorrhoids, over the past year they have been a problem but they were typically only 1 or 2 days a week where it was uncomfortable, with some bleeding or itching or occasionally some discomfort.  Tells me that he never got the Hydrocortisone  ointment as prescribed by our office or by the ER.  He just got Lidocaine  yesterday but tells me over the past few days it has not been as much of a problem.  Since March of this year he has had 3/7 days a week where he has discomfort.  Also when passing a stool it sometimes hurts.  For the most part tries to maintain soft normal bowel movements but occasionally has to strain.  He has used over-the-counter Preparation H which temporarily makes things better.    Denies fever, chills, weight loss or currently blood in his stool.  Past Medical History:  Diagnosis Date   Anxiety    GERD (gastroesophageal reflux disease)     Past Surgical History:  Procedure Laterality Date   BANKART REPAIR Left 03/05/2022   Procedure: ARTHROSCOPIC BANKART REPAIR;  Surgeon: Micheline Ahr, MD;  Location: [facility];  Service: Orthopedics;  Laterality: Left;   SHOULDER ARTHROSCOPY Left 03/05/2022   Procedure:  ARTHROSCOPY SHOULDER;  Surgeon: Micheline Ahr, MD;  Location:  SURGERY CENTER;  Service: Orthopedics;  Laterality: Left;    Current Outpatient Medications  Medication Sig Dispense Refill   Acetaminophen  Extra Strength 500 MG CAPS Take 2 tablets (1,000 mg) by mouth every 8 (eight) hours as needed for pain. 84 capsule 0   calcium carbonate (TUMS - DOSED IN MG ELEMENTAL CALCIUM) 500 MG chewable tablet Chew 1 tablet by mouth as needed for indigestion or heartburn.     diclofenac  (VOLTAREN ) 75 MG EC tablet Take 1 tablet by mouth twice a day as needed for pain 60 tablet 0   famotidine  (PEPCID ) 40 MG tablet TAKE 1 TABLET(40 MG) BY MOUTH DAILY 30 tablet 0   hydrocortisone  (ANUSOL -HC) 2.5 % rectal cream Place 1 Application rectally 2 (two) times daily. Use pea sized amount perianal twice daily for 14 days 30 g 1   lidocaine  (XYLOCAINE ) 5 % ointment Apply 1 Application topically as needed. 35.44 g 0   No current facility-administered medications for this visit.    Allergies as of 11/15/2023   (No Known Allergies)    Family History  Problem Relation Age of Onset   Asthma Mother    Colon cancer Neg Hx    Esophageal cancer Neg Hx    Stomach cancer Neg Hx     Social History   Socioeconomic History   Marital status: Single    Spouse name: Not on file  Number of children: Not on file   Years of education: Not on file   Highest education level: Not on file  Occupational History   Not on file  Tobacco Use   Smoking status: Never   Smokeless tobacco: Never  Vaping Use   Vaping status: Never Used  Substance and Sexual Activity   Alcohol use: Yes    Comment: occ   Drug use: No   Sexual activity: Yes  Other Topics Concern   Not on file  Social History Narrative   Not on file   Social Drivers of Health   Financial Resource Strain: Not on file  Food Insecurity: Not on file  Transportation Needs: Not on file  Physical Activity: Not on file  Stress: Not on file  Social  Connections: Not on file  Intimate Partner Violence: Not on file    Review of Systems:    Constitutional: No weight loss, fever or chills Cardiovascular: No chest pain Respiratory: No SOB  Gastrointestinal: See HPI and otherwise negative   Physical Exam:  Vital signs: BP 102/60   Pulse 65   Ht 5\' 10"  (1.778 m)   Wt 168 lb (76.2 kg)   BMI 24.11 kg/m    Constitutional:   Pleasant AA male appears to be in NAD, Well developed, Well nourished, alert and cooperative Respiratory: Respirations even and unlabored. Lungs clear to auscultation bilaterally.   No wheezes, crackles, or rhonchi.  Cardiovascular: Normal S1, S2. No MRG. Regular rate and rhythm. No peripheral edema, cyanosis or pallor.  Gastrointestinal:  Soft, nondistended, nontender. No rebound or guarding. Normal bowel sounds. No appreciable masses or hepatomegaly. Rectal: External: Normal sphincter tone, no abnormality; internal: No mass, normal sphincter tone ; anoscopy: Grade 1 internal hemorrhoids left lateral, some signs/stigmata of recent bleeding and irritation, otherwise no fissure, minimal tenderness Psychiatric: Oriented to person, place and time. Demonstrates good judgement and reason without abnormal affect or behaviors.  RELEVANT LABS AND IMAGING: CBC    Component Value Date/Time   WBC 6.3 05/01/2021 1514   RBC 5.35 05/01/2021 1514   HGB 15.2 05/01/2021 1514   HCT 45.6 05/01/2021 1514   PLT 251.0 05/01/2021 1514   MCV 85.4 05/01/2021 1514   MCH 29.7 08/23/2019 1818   MCHC 33.4 05/01/2021 1514   RDW 12.9 05/01/2021 1514   LYMPHSABS 0.9 05/01/2021 1514   MONOABS 1.0 05/01/2021 1514   EOSABS 0.0 05/01/2021 1514   BASOSABS 0.0 05/01/2021 1514    CMP     Component Value Date/Time   NA 139 05/01/2021 1514   K 3.8 05/01/2021 1514   CL 104 05/01/2021 1514   CO2 30 05/01/2021 1514   GLUCOSE 109 (H) 05/01/2021 1514   BUN 8 05/01/2021 1514   CREATININE 1.33 05/01/2021 1514   CALCIUM 9.6 05/01/2021 1514    PROT 7.5 05/01/2021 1514   ALBUMIN 4.5 05/01/2021 1514   AST 14 05/01/2021 1514   ALT 12 05/01/2021 1514   ALKPHOS 70 05/01/2021 1514   BILITOT 0.6 05/01/2021 1514   GFRNONAA NOT CALCULATED 08/23/2019 1818   GFRAA NOT CALCULATED 08/23/2019 1818    Assessment: 1.  Grade 1 internal hemorrhoids: Seen at time of anoscopy today, seems slightly better symptomatically per the patient over the past few days, question off-and-on fissure as well but could not visualize this today on exam 2.  Constipation: Only occasional per patient, likely diet/activity related  Plan: 1.  Prescribed Hydrocortisone  suppositories twice daily x 7 days #14 with 1 refill. 2.  Suggested sitz bath's for 15 to 20 minutes 2-3 times a day. 3.  Continue as needed lidocaine . 4.  Recommend the patient try and keep his bowel movements normal with fiber, water and exercise.  If needed could add in a stool softener or MiraLAX. 5.  Patient is up-to-date with colonoscopy which we reviewed 6.  Patient to follow in clinic with us  as needed. Assigned to Dr. Willy Harvest today.  Reginal Capra, PA-C [facility] 11/15/2023, 8:24 AM  Cc: No ref. provider found

## 2023-11-23 ENCOUNTER — Telehealth: Payer: Self-pay | Admitting: Physician Assistant

## 2023-11-23 DIAGNOSIS — R195 Other fecal abnormalities: Secondary | ICD-10-CM

## 2023-11-23 NOTE — Telephone Encounter (Signed)
 Patient called and stated that he was prescribed Hydrocortisone  suppositories and he feels like they have not helped in at all. Patient is requesting a call back to discuss what his next's step would be. Please advise.

## 2023-11-23 NOTE — Telephone Encounter (Signed)
 Spoke with patient. He reports, "halfway through the week he ran out of medication" Reports increased discomfort with bms. States it was a sharp pain. Reports stools were short and thin, and he did have to strain. "It looked like yesterday, I had oil and water mixed in the toilet that was orange in color." Denies bright red or dark black stools. Patient states he has not picked up his other medications from the pharmacy. Discussed recommendations from recent OV with [facility] including sitz bath and adding Miralax to medications. Patient again stated he has not picked up medications. Advised patient that Miralax is an OTC medication. Discussed with patient where he could pick up this medication, and how to take it. Patient verbalized understanding. Encouraged patient to drink 64 ounces of hydrating liquids everyday to help the Miralax work. Patient verbalized understanding.

## 2023-11-23 NOTE — Telephone Encounter (Signed)
 Attempted to reach patient. No answer, left VM for patient to return call.

## 2023-11-23 NOTE — Telephone Encounter (Signed)
 Returned call to patient. Spoke with patient and mom. Advised them of recommendations from PA. Order placed for fecal pancreatic elastase. F/U appointment scheduled for 06/23. Advised mom that patient should have refill of Hydrocortisone  suppositories at pharmacy. Also again recommended warm sitz baths and adding Miralax to help with constipation. Both patient and his mom verbalized understanding.

## 2023-11-25 DIAGNOSIS — Z419 Encounter for procedure for purposes other than remedying health state, unspecified: Secondary | ICD-10-CM | POA: Diagnosis not present

## 2023-12-06 ENCOUNTER — Encounter: Payer: Self-pay | Admitting: Physician Assistant

## 2023-12-06 ENCOUNTER — Ambulatory Visit (INDEPENDENT_AMBULATORY_CARE_PROVIDER_SITE_OTHER): Admitting: Physician Assistant

## 2023-12-06 VITALS — BP 124/72 | HR 84 | Ht 70.0 in | Wt 163.0 lb

## 2023-12-06 DIAGNOSIS — K640 First degree hemorrhoids: Secondary | ICD-10-CM

## 2023-12-06 MED ORDER — POLYETHYLENE GLYCOL 3350 17 GM/SCOOP PO POWD: 17.0000 g | Freq: Every day | ORAL | 3 refills | Status: AC

## 2023-12-06 NOTE — Patient Instructions (Signed)
 Follow up with Dr. Avram for possible banding.   _______________________________________________________  If your blood pressure at your visit was 140/90 or greater, please contact your primary care physician to follow up on this.  _______________________________________________________  If you are age 22 or older, your body mass index should be between 23-30. Your Body mass index is 23.39 kg/m. If this is out of the aforementioned range listed, please consider follow up with your Primary Care Provider.  If you are age 39 or younger, your body mass index should be between 19-25. Your Body mass index is 23.39 kg/m. If this is out of the aformentioned range listed, please consider follow up with your Primary Care Provider.   ________________________________________________________  The [facility] providers would like to encourage you to use MYCHART to communicate with providers for non-urgent requests or questions.  Due to long hold times on the telephone, sending your provider a message by [facility] may be a faster and more efficient way to get a response.  Please allow 48 business hours for a response.  Please remember that this is for non-urgent requests.  _______________________________________________________

## 2023-12-06 NOTE — Progress Notes (Signed)
 Chief Complaint: Hemorrhoids  HPI:    Steven Cross is a 23 year old African-American male with past medical history as listed below including anxiety and reflux, assigned to Dr. Avram at last visit, who returns to clinic today for follow-up of his hemorrhoids.    12/25/2020 colonoscopy done for rectal bleeding with internal hemorrhoids.  Otherwise normal.    12/25/2020 EGD with minimal inflammation characterized by erythema in the gastric antrum.  Diagnosed with gastritis.  Biopsies normal.    11/13/2023 patient seen in the ER for painful anal swelling.  That time found to have 2 large external hemorrhoids which were nonthrombosed.  He was given Hydrocortisone  and Lidocaine .       11/15/2023 patient seen in clinic and at that time discussed issues off-and-on with hemorrhoids.  He had been to the ER recently and given Hydrocortisone  ointment but had not been using it.  On exam that day and anoscopy with grade 1 internal hemorrhoids left lateral, given Hydrocortisone  suppositories twice daily for 7 days and suggested sitz bath's and possibly MiraLAX for off-and-on constipation.    Today, patient returns to clinic and tells me that he use the Suppositories for 3 days in a row twice a day but then had some extreme pain and so he stopped using them completely.  He was just using over-the-counter lidocaine  and it seemed to go away for a little bit but then he started having pain again and used suppositories again.  Over the past couple of days he has not noticed much other than a little inflammation, in fact he feels like he felt a hemorrhoid back there this morning and it was uncomfortable.  He did use the Hydrocortisone  ointment this morning.  He is just irritated by the ongoing discomfort.  He has been having regular bowel movements.    Denies fever, chills or weight loss.  Past Medical History:  Diagnosis Date   Anxiety    GERD (gastroesophageal reflux disease)     Past Surgical History:  Procedure  Laterality Date   BANKART REPAIR Left 03/05/2022   Procedure: ARTHROSCOPIC BANKART REPAIR;  Surgeon: Cristy Bonner DASEN, MD;  Location: [facility];  Service: Orthopedics;  Laterality: Left;   SHOULDER ARTHROSCOPY Left 03/05/2022   Procedure: ARTHROSCOPY SHOULDER;  Surgeon: Cristy Bonner DASEN, MD;  Location: [facility];  Service: Orthopedics;  Laterality: Left;    Current Outpatient Medications  Medication Sig Dispense Refill   hydrocortisone  (ANUSOL -HC) 2.5 % rectal cream Place 1 Application rectally 2 (two) times daily. Use pea sized amount perianal twice daily for 14 days 30 g 1   lidocaine  (XYLOCAINE ) 5 % ointment Apply 1 Application topically as needed. 35.44 g 0   Acetaminophen  Extra Strength 500 MG CAPS Take 2 tablets (1,000 mg) by mouth every 8 (eight) hours as needed for pain. (Patient not taking: Reported on 11/15/2023) 84 capsule 0   calcium carbonate (TUMS - DOSED IN MG ELEMENTAL CALCIUM) 500 MG chewable tablet Chew 1 tablet by mouth as needed for indigestion or heartburn. (Patient not taking: Reported on 11/15/2023)     diclofenac  (VOLTAREN ) 75 MG EC tablet Take 1 tablet by mouth twice a day as needed for pain (Patient not taking: Reported on 11/15/2023) 60 tablet 0   famotidine  (PEPCID ) 40 MG tablet TAKE 1 TABLET(40 MG) BY MOUTH DAILY (Patient not taking: Reported on 11/15/2023) 30 tablet 0   hydrocortisone  (ANUSOL -HC) 25 MG suppository Place 1 suppository (25 mg total) rectally every 12 (twelve) hours. 12  suppository 1   No current facility-administered medications for this visit.    Allergies as of 12/06/2023   (No Known Allergies)    Family History  Problem Relation Age of Onset   Asthma Mother    Colon cancer Neg Hx    Esophageal cancer Neg Hx    Stomach cancer Neg Hx     Social History   Socioeconomic History   Marital status: Single    Spouse name: Not on file   Number of children: Not on file   Years of education: Not on file   Highest education  level: Not on file  Occupational History   Not on file  Tobacco Use   Smoking status: Never   Smokeless tobacco: Never  Vaping Use   Vaping status: Never Used  Substance and Sexual Activity   Alcohol use: Yes    Comment: occ   Drug use: No   Sexual activity: Yes  Other Topics Concern   Not on file  Social History Narrative   Not on file   Social Drivers of Health   Financial Resource Strain: Not on file  Food Insecurity: Not on file  Transportation Needs: Not on file  Physical Activity: Not on file  Stress: Not on file  Social Connections: Not on file  Intimate Partner Violence: Not on file    Review of Systems:    Constitutional: No weight loss, fever or chills Cardiovascular: No chest pain Respiratory: No SOB  Gastrointestinal: See HPI and otherwise negative   Physical Exam:  Vital signs: BP 124/72 (BP Location: Right Arm)   Pulse 84   Ht 5' 10 (1.778 m)   Wt 163 lb (73.9 kg)   BMI 23.39 kg/m    Constitutional:   Pleasant AA male appears to be in NAD, Well developed, Well nourished, alert and cooperative Respiratory: Respirations even and unlabored. Lungs clear to auscultation bilaterally.   No wheezes, crackles, or rhonchi.  Cardiovascular: Normal S1, S2. No MRG. Regular rate and rhythm. No peripheral edema, cyanosis or pallor.  Gastrointestinal:  Soft, nondistended, nontender. No rebound or guarding. Normal bowel sounds. No appreciable masses or hepatomegaly. Rectal: External: No abnormality; internal: Mild discomfort with palpation, no fissure, no residue; anoscopy: Again grade 1 hemorrhoids in the left lateral position Psychiatric: Oriented to person, place and time. Demonstrates good judgement and reason without abnormal affect or behaviors.  RELEVANT LABS AND IMAGING: CBC    Component Value Date/Time   WBC 6.3 05/01/2021 1514   RBC 5.35 05/01/2021 1514   HGB 15.2 05/01/2021 1514   HCT 45.6 05/01/2021 1514   PLT 251.0 05/01/2021 1514   MCV 85.4  05/01/2021 1514   MCH 29.7 08/23/2019 1818   MCHC 33.4 05/01/2021 1514   RDW 12.9 05/01/2021 1514   LYMPHSABS 0.9 05/01/2021 1514   MONOABS 1.0 05/01/2021 1514   EOSABS 0.0 05/01/2021 1514   BASOSABS 0.0 05/01/2021 1514    CMP     Component Value Date/Time   NA 139 05/01/2021 1514   K 3.8 05/01/2021 1514   CL 104 05/01/2021 1514   CO2 30 05/01/2021 1514   GLUCOSE 109 (H) 05/01/2021 1514   BUN 8 05/01/2021 1514   CREATININE 1.33 05/01/2021 1514   CALCIUM 9.6 05/01/2021 1514   PROT 7.5 05/01/2021 1514   ALBUMIN 4.5 05/01/2021 1514   AST 14 05/01/2021 1514   ALT 12 05/01/2021 1514   ALKPHOS 70 05/01/2021 1514   BILITOT 0.6 05/01/2021 1514   GFRNONAA NOT  CALCULATED 08/23/2019 1818   GFRAA NOT CALCULATED 08/23/2019 1818    Assessment: 1.  Internal hemorrhoids: Tried treatment with suppositories and hydrocortisone  cream, patient relays more pain than I feel like is possible at this point given exam findings, up-to-date on colonoscopy; internal hemorrhoids  Plan: 1.  Scheduled patient with Dr. Avram and one of his banding slots.  He can do another anoscopy and decide whether or not patient may benefit from hemorrhoid banding given his ongoing discomfort. 2.  Patient to continue Hydrocortisone  ointment/suppositories as needed in the meantime 3.  Patient follow in clinic per recommendations from Dr. Avram after time of procedure.  Steven Failing, PA-C [facility] 12/06/2023, 1:39 PM

## 2023-12-14 ENCOUNTER — Ambulatory Visit: Admitting: Internal Medicine

## 2023-12-14 ENCOUNTER — Encounter: Payer: Self-pay | Admitting: Internal Medicine

## 2023-12-14 VITALS — BP 118/70 | Ht 70.0 in | Wt 165.0 lb

## 2023-12-14 DIAGNOSIS — K6289 Other specified diseases of anus and rectum: Secondary | ICD-10-CM | POA: Diagnosis not present

## 2023-12-14 DIAGNOSIS — K641 Second degree hemorrhoids: Secondary | ICD-10-CM | POA: Diagnosis not present

## 2023-12-14 DIAGNOSIS — K648 Other hemorrhoids: Secondary | ICD-10-CM

## 2023-12-14 MED ORDER — AMBULATORY NON FORMULARY MEDICATION: 3 refills | Status: AC

## 2023-12-14 NOTE — Progress Notes (Signed)
 Steven Cross 23 y.o. Apr 17, 2001 [id number]  Assessment & Plan:   Encounter Diagnoses  Name Primary?   Prolapsed internal hemorrhoids Yes   Rectal pain    Patient has significant rectal pain and he also has what sound like prolapsing internal hemorrhoids.  When he was in the emergency department May 31 he had 2 external hemorrhoids protruding per EDMD but I think those might have been prolapsed internal hemorrhoids.  I cannot find a fissure today perhaps he is having significant spasm problems.  We talked about using hemorrhoidal ligation but my concern is he could have significant post banding pain.  After our discussion about this he declined banding.  Will refer to colorectal surgery for their opinion.  Sounds like he might benefit from an exam under anesthesia plus or minus hemorrhoidectomy.  Diltiazem/lidocaine  cream 2-3 times a day applied into the anal canal (after defecation and at bedtime at least)  Add Benefiber to current regimen to try to help reduce straining to stool     Subjective:   Chief Complaint: Hemorrhoids  HPI 23 year old man with a history of hemorrhoids seen initially on a colonoscopy in 2022 (Dr. Teressa saw internal hemorrhoids) who was in the emergency department 11/13/2023 with what was described as to external hemorrhoids.  He was having a day or more of pain with protruding hemorrhoids.  He has intermittent rectal bleeding.  He has intermittent symptoms of what sound like prolapsing hemorrhoids.  Mainly sees blood on the toilet paper but sometimes will have blood in the commode and it will drip as well.  He has used hydrocortisone  cream and suppositories.  Strains to stool moves his bowels about 6-8 times a week.  He will get an urge and he will strain and when he does defecate typically afterwards there is pain which sometimes can be severe.  The longest it lasted was for 1 to 2 days and he relates that to the time he went to the emergency department  with the prolapsed hemorrhoids I described above.  Sometimes he is unable to sit because it feels like he sitting on a knife after defecation. No Known Allergies Current Meds  Medication Sig   AMBULATORY NON FORMULARY MEDICATION Medication Name: diltiazem 2% mixed with Lidocaine  5% Sig: apply a pea size amount to rectum 2-3 times a day   hydrocortisone  (ANUSOL -HC) 2.5 % rectal cream Place 1 Application rectally 2 (two) times daily. Use pea sized amount perianal twice daily for 14 days   lidocaine  (XYLOCAINE ) 5 % ointment Apply 1 Application topically as needed.   polyethylene glycol powder (GLYCOLAX /MIRALAX ) 17 GM/SCOOP powder Take 17 g by mouth daily.   Past Medical History:  Diagnosis Date   Anxiety    GERD (gastroesophageal reflux disease)    Past Surgical History:  Procedure Laterality Date   BANKART REPAIR Left 03/05/2022   Procedure: ARTHROSCOPIC BANKART REPAIR;  Surgeon: Cristy Bonner DASEN, MD;  Location: [facility];  Service: Orthopedics;  Laterality: Left;   COLONOSCOPY WITH ESOPHAGOGASTRODUODENOSCOPY (EGD)  12/25/2020   Gastritis and internal hemorrhoids   SHOULDER ARTHROSCOPY Left 03/05/2022   Procedure: ARTHROSCOPY SHOULDER;  Surgeon: Cristy Bonner DASEN, MD;  Location: [facility];  Service: Orthopedics;  Laterality: Left;   Social History   Social History Narrative   Water quality scientist at [employer]   family history includes Asthma in his mother.   Review of Systems As per HPI  Objective:   Physical Exam BP 118/70  Ht 5' 10 (1.778 m)   Wt 165 lb (74.8 kg)   BMI 23.68 kg/m  NAD  Rectal NL anoderm Tender post> ant no mass  Anoscopy   LL and RP hemorrhoid complexes Gr 2 - tender on anoscopic exam No fissure except 1 occasion ??

## 2023-12-14 NOTE — Patient Instructions (Signed)
 Your provider has prescribed Diltiazem/lidocaine  gel for you. Please follow the directions written on your prescription bottle or given to you specifically by your provider. Since this is a specialty medication and is not readily available at most local pharmacies, we have sent your prescription to:  [facility] information is below: Address: [address] [place], [place] [address]  [phone]  *Please DO NOT go directly from our office to pick up this medication! Give the pharmacy 1 day to process the prescription as this is compounded and takes time to make.   Take benefiber daily. Handout provided.  You have been scheduled for an appointment with _____________ at [facility]. Your appointment is on ________________ at ____________. Please arrive at _______________ for registration. Make certain to bring a list of current medications, including any over the counter medications or vitamins. Also bring your co-pay if you have one as well as your insurance cards. [facility] is located at [address]. Should you need to reschedule your appointment, please contact them at [phone].   I appreciate the opportunity to care for you. Lupita Commander, MD, [facility]

## 2023-12-25 DIAGNOSIS — Z419 Encounter for procedure for purposes other than remedying health state, unspecified: Secondary | ICD-10-CM | POA: Diagnosis not present

## 2023-12-27 ENCOUNTER — Ambulatory Visit: Admitting: Physician Assistant

## 2024-01-18 DIAGNOSIS — K641 Second degree hemorrhoids: Secondary | ICD-10-CM | POA: Diagnosis not present

## 2024-01-25 DIAGNOSIS — Z419 Encounter for procedure for purposes other than remedying health state, unspecified: Secondary | ICD-10-CM | POA: Diagnosis not present

## 2024-02-25 DIAGNOSIS — Z419 Encounter for procedure for purposes other than remedying health state, unspecified: Secondary | ICD-10-CM | POA: Diagnosis not present

## 2024-05-26 DIAGNOSIS — Z419 Encounter for procedure for purposes other than remedying health state, unspecified: Secondary | ICD-10-CM | POA: Diagnosis not present
